# Patient Record
Sex: Female | Born: 2000
Health system: Southern US, Community
[De-identification: ages and names within clinical notes are randomized; demographics above are authoritative.]

## PROBLEM LIST (undated history)

## (undated) DIAGNOSIS — D649 Anemia, unspecified: Secondary | ICD-10-CM

---
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
## 2018-04-17 DIAGNOSIS — Z23 Encounter for immunization: Secondary | ICD-10-CM | POA: Diagnosis not present

## 2018-10-18 DIAGNOSIS — L709 Acne, unspecified: Secondary | ICD-10-CM | POA: Diagnosis not present

## 2018-10-18 DIAGNOSIS — L810 Postinflammatory hyperpigmentation: Secondary | ICD-10-CM | POA: Diagnosis not present

## 2019-02-07 ENCOUNTER — Encounter: Payer: Self-pay | Admitting: Family Medicine

## 2019-02-07 ENCOUNTER — Other Ambulatory Visit: Payer: Self-pay

## 2019-02-07 ENCOUNTER — Ambulatory Visit (INDEPENDENT_AMBULATORY_CARE_PROVIDER_SITE_OTHER): Payer: BLUE CROSS/BLUE SHIELD | Admitting: Family Medicine

## 2019-02-07 VITALS — BP 108/60 | HR 91 | Temp 99.2°F | Resp 12 | Ht 63.0 in | Wt 148.0 lb

## 2019-02-07 DIAGNOSIS — Z789 Other specified health status: Secondary | ICD-10-CM

## 2019-02-07 DIAGNOSIS — E663 Overweight: Secondary | ICD-10-CM | POA: Diagnosis not present

## 2019-02-07 NOTE — Progress Notes (Signed)
Subjective:     Patient ID: Hannah Sampson, female   DOB: 05/25/2001, 18 y.o.   MRN: [id number]  Hannah Sampson presents for New Patient (Initial Visit) (establish care)  Hannah Sampson is a 18 year old female patient who was previously seen by the pediatrician here in town.  Has a history of anemia and heart murmur.  No surgical history noted.  Only history of her family that she knows is her father has hypertension.  Denies use of tobacco, alcohol, drug use.  Sexually active uses pill and condoms.  Lives with her mom dad and brother.  No pets in the home.  Works at [employer] as a Scientist, water quality.  Enjoys reading fiction books.  Enjoys working at this time secondary to the pandemic Boon gets out of the house.  Reports that she eats a wide variety of foods including red meats.  But is not a big sweets person.  But does eat some chips and snacks like that.  Reports she might drink 1 soda a week.  Reports she tries drink 4 bottles of water daily.  Reports that she wear sunscreen and seatbelt.  Reports that she has smoke detectors at home.  Reports she does not use her phone while she is driving.  Health maintenance: Reports she sees the eye and dentist regularly.  Is in need of HIV screening.  In need of tetanus up-to-date.  And flu vaccine coming up.  Otherwise she is been compliant with her healthcare needs.  Is not of age for Pap smear at this time.  Would like to go to GYN for women's health care needs.  Only medication at this time is occasional use of Aleve as needed.  And birth control-Sprintec.  Which she takes on a regular basis.  Without issue.  Overall today she has no issues and does not have anything that she needs to talk about her discussed.  Reports that she just wanted to get into that she woul have somebody come to if she were to get sick.  Today patient denies signs and symptoms of COVID 19 infection including fever, chills, cough, shortness of breath, and headache.  Past Medical,  Surgical, Social History, Allergies, and Medications have been Reviewed.   Past Medical History:  Diagnosis Date  . Anemia    No past surgical history on file. Social History   Socioeconomic History  . Marital status: Single    Spouse name: Not on file  . Number of children: Not on file  . Years of education: Not on file  . Highest education level: Not on file  Occupational History  . Not on file  Social Needs  . Financial resource strain: Not on file  . Food insecurity    Worry: Not on file    Inability: Not on file  . Transportation needs    Medical: Not on file    Non-medical: Not on file  Tobacco Use  . Smoking status: Never Smoker  . Smokeless tobacco: Never Used  Substance and Sexual Activity  . Alcohol use: No  . Drug use: No  . Sexual activity: Not on file  Lifestyle  . Physical activity    Days per week: Not on file    Minutes per session: Not on file  . Stress: Not on file  Relationships  . Social Herbalist on phone: Not on file    Gets together: Not on file    Attends religious service: Not on file  Active member of club or organization: Not on file    Attends meetings of clubs or organizations: Not on file    Relationship status: Not on file  . Intimate partner violence    Fear of current or ex partner: Not on file    Emotionally abused: Not on file    Physically abused: Not on file    Forced sexual activity: Not on file  Other Topics Concern  . Not on file  Social History Narrative  . Not on file    Outpatient Encounter Medications as of 02/07/2019  Medication Sig  . naproxen sodium (ALEVE) 220 MG tablet Take 440 mg by mouth once as needed (for pain).  . norgestimate-ethinyl estradiol (SPRINTEC 28) 0.25-35 MG-MCG tablet Take 1 tablet by mouth daily.   No facility-administered encounter medications on file as of 02/07/2019.    No Known Allergies  Review of Systems  Constitutional: Negative for chills and fever.  HENT:  Negative.   Eyes: Negative for visual disturbance.  Respiratory: Negative for cough and shortness of breath.   Cardiovascular: Negative.   Endocrine: Negative.   Genitourinary: Negative.   Musculoskeletal: Negative.   Skin: Negative.   Allergic/Immunologic: Negative.   Neurological: Negative for dizziness and headaches.  Hematological: Negative.   Psychiatric/Behavioral: Negative.   All other systems reviewed and are negative.      Objective:     BP 108/60   Pulse 91   Temp 99.2 F (37.3 C) (Oral)   Resp 12   Ht 5\' 3"  (1.6 m)   Wt 148 lb 0.6 oz (67.2 kg)   SpO2 98%   BMI 26.22 kg/m   Physical Exam Vitals signs and nursing note reviewed.  Constitutional:      Appearance: Normal appearance.  Neck:     Musculoskeletal: Normal range of motion and neck supple.  Cardiovascular:     Rate and Rhythm: Normal rate and regular rhythm.     Pulses: Normal pulses.     Heart sounds: Normal heart sounds.  Pulmonary:     Effort: Pulmonary effort is normal.     Breath sounds: Normal breath sounds.  Musculoskeletal: Normal range of motion.  Skin:    General: Skin is warm.  Neurological:     Mental Status: She is alert and oriented to person, place, and time.  Psychiatric:        Mood and Affect: Mood normal.        Behavior: Behavior normal.        Thought Content: Thought content normal.        Judgment: Judgment normal.      Depression screen PHQ 2/9 02/07/2019  Decreased Interest 0  Down, Depressed, Hopeless 0  PHQ - 2 Score 0        Assessment and Plan       1. Uses birth control Will need pap in next year. Continue BC at this time. Encouraged her to practice safe sex. [facility] Appreicate collaboration in her care.  - Ambulatory referral to Obstetrics / Gynecology  2. Overweight (BMI 25.0-29.9) Encourage exercise and well balanced diet to help with BMI.  Follow Up: 1 year for annual visit without pap   Hannah FinnerHannah M.  Hannah Ruest, DNP, AGNP-BC [facility] [address], [address] [place], [place] [address] Office Hours: Mon-Thurs 8 am-5 pm; Fri 8 am-12 pm Office Phone:  [phone]  Office Fax: [phone]

## 2019-02-07 NOTE — Patient Instructions (Signed)
    Thank you for coming into the office today. I appreciate the opportunity to provide you with the care for your health and wellness. Today we discussed: overall health  Follow Up: 1 year annual no pap, or as needed  No labs today.  Referral to [facility] for birth control needs and pap once of age.  Continue water intake. Work to walk 30-60 minutes daily 5 days of the week Eat a well balanced diet.  Please continue to practice social distancing to keep you, your family, and our community safe.  If you must go out, please wear a Mask and practice good handwashing.  [place] YOUR HANDS WELL AND FREQUENTLY. AVOID TOUCHING YOUR FACE, UNLESS YOUR HANDS ARE FRESHLY WASHED.  GET FRESH AIR DAILY. STAY HYDRATED WITH WATER.   It was a pleasure to see you and I look forward to continuing to work together on your health and well-being. Please do not hesitate to call the office if you need care or have questions about your care.  Have a wonderful day and week. With Gratitude,  Cherly Beach, DNP, AGNP-BC

## 2019-02-21 ENCOUNTER — Ambulatory Visit (INDEPENDENT_AMBULATORY_CARE_PROVIDER_SITE_OTHER): Payer: BLUE CROSS/BLUE SHIELD | Admitting: Obstetrics and Gynecology

## 2019-02-21 ENCOUNTER — Other Ambulatory Visit: Payer: Self-pay

## 2019-02-21 ENCOUNTER — Encounter: Payer: Self-pay | Admitting: Obstetrics and Gynecology

## 2019-02-21 DIAGNOSIS — Z113 Encounter for screening for infections with a predominantly sexual mode of transmission: Secondary | ICD-10-CM

## 2019-02-21 DIAGNOSIS — Z202 Contact with and (suspected) exposure to infections with a predominantly sexual mode of transmission: Secondary | ICD-10-CM | POA: Insufficient documentation

## 2019-02-21 NOTE — Progress Notes (Signed)
Hannah Sampson presents in referral from her PCP to establish GYN care and STD testing She is currently on OCP's and doing well. Sexual active with same partner x 2 yrs First age intercourse 2 and 5 lifetime partners No H/O STD's No chronic medical problems  Nulligravid No surgery Denies habits  PE AF VSS Lungs clear Heart RRR Abd soft + BS  A/P STD exposure  STD testing ordered. Continue with OCP's. F/U in Feb 2021 for yearly exam

## 2019-02-21 NOTE — Patient Instructions (Signed)
Health Maintenance, Female Adopting a healthy lifestyle and getting preventive care are important in promoting health and wellness. Ask your health care provider about:  The right schedule for you to have regular tests and exams.  Things you can do on your own to prevent diseases and keep yourself healthy. What should I know about diet, weight, and exercise? Eat a healthy diet   Eat a diet that includes plenty of vegetables, fruits, low-fat dairy products, and lean protein.  Do not eat a lot of foods that are high in solid fats, added sugars, or sodium. Maintain a healthy weight Body mass index (BMI) is used to identify weight problems. It estimates body fat based on height and weight. Your health care provider can help determine your BMI and help you achieve or maintain a healthy weight. Get regular exercise Get regular exercise. This is one of the most important things you can do for your health. Most adults should:  Exercise for at least 150 minutes each week. The exercise should increase your heart rate and make you sweat (moderate-intensity exercise).  Do strengthening exercises at least twice a week. This is in addition to the moderate-intensity exercise.  Spend less time sitting. Even light physical activity can be beneficial. Watch cholesterol and blood lipids Have your blood tested for lipids and cholesterol at 20 years of age, then have this test every 5 years. Have your cholesterol levels checked more often if:  Your lipid or cholesterol levels are high.  You are older than 20 years of age.  You are at high risk for heart disease. What should I know about cancer screening? Depending on your health history and family history, you may need to have cancer screening at various ages. This may include screening for:  Breast cancer.  Cervical cancer.  Colorectal cancer.  Skin cancer.  Lung cancer. What should I know about heart disease, diabetes, and high blood  pressure? Blood pressure and heart disease  High blood pressure causes heart disease and increases the risk of stroke. This is more likely to develop in people who have high blood pressure readings, are of African descent, or are overweight.  Have your blood pressure checked: ? Every 3-5 years if you are 18-39 years of age. ? Every year if you are 40 years old or older. Diabetes Have regular diabetes screenings. This checks your fasting blood sugar level. Have the screening done:  Once every three years after age 40 if you are at a normal weight and have a low risk for diabetes.  More often and at a younger age if you are overweight or have a high risk for diabetes. What should I know about preventing infection? Hepatitis B If you have a higher risk for hepatitis B, you should be screened for this virus. Talk with your health care provider to find out if you are at risk for hepatitis B infection. Hepatitis C Testing is recommended for:  Everyone born from 1945 through 1965.  Anyone with known risk factors for hepatitis C. Sexually transmitted infections (STIs)  Get screened for STIs, including gonorrhea and chlamydia, if: ? You are sexually active and are younger than 20 years of age. ? You are older than 20 years of age and your health care provider tells you that you are at risk for this type of infection. ? Your sexual activity has changed since you were last screened, and you are at increased risk for chlamydia or gonorrhea. Ask your health care provider if   you are at risk.  Ask your health care provider about whether you are at high risk for HIV. Your health care provider may recommend a prescription medicine to help prevent HIV infection. If you choose to take medicine to prevent HIV, you should first get tested for HIV. You should then be tested every 3 months for as long as you are taking the medicine. Pregnancy  If you are about to stop having your period (premenopausal) and  you may become pregnant, seek counseling before you get pregnant.  Take 400 to 800 micrograms (mcg) of folic acid every day if you become pregnant.  Ask for birth control (contraception) if you want to prevent pregnancy. Osteoporosis and menopause Osteoporosis is a disease in which the bones lose minerals and strength with aging. This can result in bone fractures. If you are 65 years old or older, or if you are at risk for osteoporosis and fractures, ask your health care provider if you should:  Be screened for bone loss.  Take a calcium or vitamin D supplement to lower your risk of fractures.  Be given hormone replacement therapy (HRT) to treat symptoms of menopause. Follow these instructions at home: Lifestyle  Do not use any products that contain nicotine or tobacco, such as cigarettes, e-cigarettes, and chewing tobacco. If you need help quitting, ask your health care provider.  Do not use street drugs.  Do not share needles.  Ask your health care provider for help if you need support or information about quitting drugs. Alcohol use  Do not drink alcohol if: ? Your health care provider tells you not to drink. ? You are pregnant, may be pregnant, or are planning to become pregnant.  If you drink alcohol: ? Limit how much you use to 0-1 drink a day. ? Limit intake if you are breastfeeding.  Be aware of how much alcohol is in your drink. In the [place] one drink equals one 12 oz bottle of beer (355 mL), one 5 oz glass of wine (148 mL), or one 1 oz glass of hard liquor (44 mL). General instructions  Schedule regular health, dental, and eye exams.  Stay current with your vaccines.  Tell your health care provider if: ? You often feel depressed. ? You have ever been abused or do not feel safe at home. Summary  Adopting a healthy lifestyle and getting preventive care are important in promoting health and wellness.  Follow your health care provider's instructions about healthy  diet, exercising, and getting tested or screened for diseases.  Follow your health care provider's instructions on monitoring your cholesterol and blood pressure. This information is not intended to replace advice given to you by your health care provider. Make sure you discuss any questions you have with your health care provider. Document Released: 01/18/2011 Document Revised: 06/28/2018 Document Reviewed: 06/28/2018 Elsevier Patient Education  2020 [employer]vier Inc[employer]  

## 2019-02-22 LAB — SYPHILIS: RPR W/REFLEX TO RPR TITER AND TREPONEMAL ANTIBODIES, TRADITIONAL SCREENING AND DIAGNOSIS ALGORITHM: RPR Ser Ql: NONREACTIVE

## 2019-02-25 LAB — GC/CHLAMYDIA PROBE AMP
Chlamydia trachomatis, NAA: NEGATIVE
Neisseria Gonorrhoeae by PCR: NEGATIVE

## 2019-02-25 LAB — SPECIMEN STATUS REPORT

## 2019-06-05 ENCOUNTER — Other Ambulatory Visit: Payer: Self-pay | Admitting: *Deleted

## 2019-06-05 DIAGNOSIS — Z20822 Contact with and (suspected) exposure to covid-19: Secondary | ICD-10-CM

## 2019-06-07 LAB — NOVEL CORONAVIRUS, NAA: SARS-CoV-2, NAA: NOT DETECTED

## 2019-06-27 ENCOUNTER — Other Ambulatory Visit: Payer: Self-pay

## 2019-06-27 DIAGNOSIS — Z20822 Contact with and (suspected) exposure to covid-19: Secondary | ICD-10-CM

## 2019-06-28 LAB — NOVEL CORONAVIRUS, NAA: SARS-CoV-2, NAA: NOT DETECTED

## 2019-07-11 ENCOUNTER — Other Ambulatory Visit: Payer: Self-pay

## 2019-07-11 ENCOUNTER — Ambulatory Visit: Payer: BLUE CROSS/BLUE SHIELD | Attending: Internal Medicine

## 2019-07-11 DIAGNOSIS — Z20822 Contact with and (suspected) exposure to covid-19: Secondary | ICD-10-CM

## 2019-07-12 LAB — SPECIMEN STATUS REPORT

## 2019-07-12 LAB — NOVEL CORONAVIRUS, NAA: SARS-CoV-2, NAA: NOT DETECTED

## 2019-09-22 ENCOUNTER — Ambulatory Visit: Payer: BLUE CROSS/BLUE SHIELD | Attending: Internal Medicine

## 2019-10-22 ENCOUNTER — Ambulatory Visit: Payer: BLUE CROSS/BLUE SHIELD | Attending: Internal Medicine

## 2019-10-22 ENCOUNTER — Other Ambulatory Visit: Payer: Self-pay

## 2019-10-22 DIAGNOSIS — Z20822 Contact with and (suspected) exposure to covid-19: Secondary | ICD-10-CM

## 2019-10-23 LAB — SARS-COV-2, NAA 2 DAY TAT

## 2019-10-23 LAB — NOVEL CORONAVIRUS, NAA: SARS-CoV-2, NAA: NOT DETECTED

## 2020-02-12 ENCOUNTER — Ambulatory Visit: Payer: BLUE CROSS/BLUE SHIELD | Admitting: Family Medicine

## 2020-08-17 ENCOUNTER — Other Ambulatory Visit: Payer: Self-pay

## 2020-08-17 ENCOUNTER — Ambulatory Visit
Admission: RE | Admit: 2020-08-17 | Discharge: 2020-08-17 | Disposition: A | Discharge status: Home / Self Care | Payer: 59 | Source: Ambulatory Visit | Attending: Emergency Medicine | Admitting: Emergency Medicine

## 2020-08-17 VITALS — BP 154/89 | HR 94 | Temp 98.0°F | Resp 18 | Ht 63.0 in | Wt 160.0 lb

## 2020-08-17 DIAGNOSIS — J069 Acute upper respiratory infection, unspecified: Secondary | ICD-10-CM

## 2020-08-17 DIAGNOSIS — Z1152 Encounter for screening for COVID-19: Secondary | ICD-10-CM

## 2020-08-17 MED ORDER — PREDNISONE 10 MG PO TABS: 20.0000 mg | ORAL_TABLET | Freq: Every day | ORAL | 0 refills | Status: AC

## 2020-08-17 MED ORDER — CETIRIZINE HCL 10 MG PO TABS: 10.0000 mg | ORAL_TABLET | Freq: Every day | ORAL | 0 refills | Status: DC

## 2020-08-17 MED ORDER — BENZONATATE 100 MG PO CAPS: 100.0000 mg | ORAL_CAPSULE | Freq: Three times a day (TID) | ORAL | 0 refills | Status: DC | PRN

## 2020-08-17 MED ORDER — FLUTICASONE PROPIONATE 50 MCG/ACT NA SUSP: 1.0000 | Freq: Every day | NASAL | 0 refills | Status: DC

## 2020-08-17 NOTE — ED Provider Notes (Signed)
[facility]   [id number] 08/17/20 Arrival Time: 0950   CC: URI  SUBJECTIVE: History from: patient.  Hannah Sampson is a 20 y.o. female who presented to the urgent care for complaint of cough, nasal congestion with yellow nasal discharge for the past 5 days.  Report a negative COVID-19 test 4 days ago via rapid test.  Denies sick exposure to COVID, flu or strep.  Denies recent travel.  Has tried OTC medication without relief.  Denies alleviating or aggravating factors.  Denies  previous symptoms in the past.   Denies fever, chills, fatigue, sinus pain, rhinorrhea, sore throat, SOB, wheezing, chest pain, nausea, changes in bowel or bladder habits.     ROS: As per HPI.  All other pertinent ROS negative.     Past Medical History:  Diagnosis Date  . Anemia    History reviewed. No pertinent surgical history. No Known Allergies No current facility-administered medications on file prior to encounter.   Current Outpatient Medications on File Prior to Encounter  Medication Sig Dispense Refill  . naproxen sodium (ALEVE) 220 MG tablet Take 440 mg by mouth once as needed (for pain).    . norgestimate-ethinyl estradiol (SPRINTEC 28) 0.25-35 MG-MCG tablet Take 1 tablet by mouth daily.     Social History   Socioeconomic History  . Marital status: Single    Spouse name: Not on file  . Number of children: Not on file  . Years of education: Not on file  . Highest education level: Some college, no degree  Occupational History  . Not on file  Tobacco Use  . Smoking status: Never Smoker  . Smokeless tobacco: Never Used  Vaping Use  . Vaping Use: Never used  Substance and Sexual Activity  . Alcohol use: No  . Drug use: No  . Sexual activity: Yes    Birth control/protection: Pill, Condom  Other Topics Concern  . Not on file  Social History Narrative   Lives with mom, dad and brother   No pets       Enjoys reading-fiction favorite Artist, enjoys working to  get outside house      Diet: meat, veggies, fruits (red meats, not big sweets, chips yes)   Caffeine: soda week   Water: 4 bottles daily      Wear seat belt    Wear sunscreen   Smoke detectors at home     Does not use phone while driving    Social Determinants of Corporate investment banker Strain: Not on file  Food Insecurity: Not on file  Transportation Needs: Not on file  Physical Activity: Not on file  Stress: Not on file  Social Connections: Not on file  Intimate Partner Violence: Not on file   Family History  Problem Relation Age of Onset  . Hypertension Father   . Diabetes Paternal Grandfather   . Diabetes Maternal Grandmother     OBJECTIVE:  Vitals:   08/17/20 1029 08/17/20 1033  BP: (!) 154/89   Pulse: 94   Resp: 18   Temp: 98 F (36.7 C)   TempSrc: Oral   SpO2: 97%   Weight:  160 lb (72.6 kg)  Height:  5\' 3"  (1.6 m)     General appearance: alert; appears fatigued, but nontoxic; speaking in full sentences and tolerating own secretions HEENT: NCAT; Ears: EACs clear, TMs pearly gray; Eyes: PERRL.  EOM grossly intact. Sinuses: nontender; Nose: nares patent without rhinorrhea, Throat: oropharynx clear, tonsils non erythematous or  enlarged, uvula midline  Neck: supple without LAD Lungs: unlabored respirations, symmetrical air entry; cough: moderate; no respiratory distress; CTAB Heart: regular rate and rhythm.  Radial pulses 2+ symmetrical bilaterally Skin: warm and dry Psychological: alert and cooperative; normal mood and affect  LABS:  No results found for this or any previous visit (from the past 24 hour(s)).   ASSESSMENT & PLAN:  1. URI with cough and congestion   2. Encounter for screening for COVID-19     Meds ordered this encounter  Medications  . predniSONE (DELTASONE) 10 MG tablet    Sig: Take 2 tablets (20 mg total) by mouth daily for 5 days.    Dispense:  10 tablet    Refill:  0  . fluticasone (FLONASE) 50 MCG/ACT nasal spray    Sig:  Place 1 spray into both nostrils daily for 14 days.    Dispense:  16 g    Refill:  0  . benzonatate (TESSALON) 100 MG capsule    Sig: Take 1 capsule (100 mg total) by mouth 3 (three) times daily as needed for cough.    Dispense:  30 capsule    Refill:  0  . cetirizine (ZYRTEC ALLERGY) 10 MG tablet    Sig: Take 1 tablet (10 mg total) by mouth daily.    Dispense:  30 tablet    Refill:  0    Discharge Instructions    COVID testing ordered.  It will take between 2-7 days for test results.  Someone will contact you regarding abnormal results.    Get plenty of rest and push fluids Tessalon Perles prescribed for cough Zyrtec for nasal congestion, runny nose, and/or sore throat Flonase for nasal congestion and runny nose Prednisone was prescribed Use medications daily for symptom relief Use OTC medications like ibuprofen or tylenol as needed fever or pain Call or go to the ED if you have any new or worsening symptoms such as fever, worsening cough, shortness of breath, chest tightness, chest pain, turning blue, changes in mental status, etc...   Reviewed expectations re: course of current medical issues. Questions answered. Outlined signs and symptoms indicating need for more acute intervention. Patient verbalized understanding. After Visit Summary given.         Durward Parcel, FNP 08/17/20 1053

## 2020-08-17 NOTE — Discharge Instructions (Signed)
COVID testing ordered.  It will take between 2-7 days for test results.  Someone will contact you regarding abnormal results.    Get plenty of rest and push fluids Tessalon Perles prescribed for cough Zyrtec for nasal congestion, runny nose, and/or sore throat Flonase for nasal congestion and runny nose Prednisone was prescribed Use medications daily for symptom relief Use OTC medications like ibuprofen or tylenol as needed fever or pain Call or go to the ED if you have any new or worsening symptoms such as fever, worsening cough, shortness of breath, chest tightness, chest pain, turning blue, changes in mental status, etc... 

## 2020-08-17 NOTE — ED Triage Notes (Signed)
Runny nose, cough, congestion and yellow nasal congestion x 5days. Had neg covid test x 4 days ago

## 2020-08-20 LAB — NOVEL CORONAVIRUS, NAA: SARS-CoV-2, NAA: DETECTED — AB

## 2020-08-21 ENCOUNTER — Telehealth: Payer: Self-pay

## 2020-08-21 NOTE — Telephone Encounter (Signed)
Called to discuss with patient about COVID-19 symptoms and the use of one of the available treatments for those with mild to moderate Covid symptoms and at a high risk of hospitalization.  Pt appears to qualify for outpatient treatment due to co-morbid conditions and/or a member of an at-risk group in accordance with the FDA Emergency Use Authorization.    Symptom onset: 08/12/20 Vaccinated:  X 1 Booster? No Immunocompromised? No Qualifiers: None  Pt. Is out of 7 day window and has no qualifiers.  Hannah Sampson

## 2020-10-22 ENCOUNTER — Other Ambulatory Visit: Payer: Self-pay

## 2020-10-22 ENCOUNTER — Ambulatory Visit (INDEPENDENT_AMBULATORY_CARE_PROVIDER_SITE_OTHER): Payer: 59 | Admitting: Nurse Practitioner

## 2020-10-22 ENCOUNTER — Encounter: Payer: Self-pay | Admitting: Nurse Practitioner

## 2020-10-22 VITALS — BP 156/92 | HR 106 | Temp 97.8°F | Ht 63.0 in | Wt 168.0 lb

## 2020-10-22 DIAGNOSIS — U071 COVID-19: Secondary | ICD-10-CM | POA: Insufficient documentation

## 2020-10-22 DIAGNOSIS — Z139 Encounter for screening, unspecified: Secondary | ICD-10-CM

## 2020-10-22 DIAGNOSIS — Z0000 Encounter for general adult medical examination without abnormal findings: Secondary | ICD-10-CM

## 2020-10-22 DIAGNOSIS — I10 Essential (primary) hypertension: Secondary | ICD-10-CM

## 2020-10-22 DIAGNOSIS — I499 Cardiac arrhythmia, unspecified: Secondary | ICD-10-CM | POA: Insufficient documentation

## 2020-10-22 HISTORY — DX: COVID-19: U07.1

## 2020-10-22 MED ORDER — AMLODIPINE BESYLATE 5 MG PO TABS: 5.0000 mg | ORAL_TABLET | Freq: Every day | ORAL | 3 refills | Status: DC

## 2020-10-22 NOTE — Assessment & Plan Note (Signed)
-  resolved today -had treatment at end of January, and has been asymptomatic for several weeks

## 2020-10-22 NOTE — Assessment & Plan Note (Addendum)
-  HR increases with inspiration; likely sinus arrhythmia -no EKG today as she has no palpitations or SOB

## 2020-10-22 NOTE — Progress Notes (Signed)
Acute Office Visit  Subjective:    Patient ID: Hannah Sampson, female    DOB: [birth year], 20 y.o.   MRN: [id number]  Chief Complaint  Patient presents with  . Follow-up    Has not been seen in awhile.    HPI Patient is in today for COVID follow-up. She was treated for COVID on 08/17/20.  She is asymptomatic today  Past Medical History:  Diagnosis Date  . Anemia     History reviewed. No pertinent surgical history.  Family History  Problem Relation Age of Onset  . Hypertension Father   . Diabetes Paternal Grandfather   . Diabetes Maternal Grandmother     Social History   Socioeconomic History  . Marital status: Single    Spouse name: Not on file  . Number of children: Not on file  . Years of education: Not on file  . Highest education level: Some college, no degree  Occupational History  . Not on file  Tobacco Use  . Smoking status: Never Smoker  . Smokeless tobacco: Never Used  Vaping Use  . Vaping Use: Never used  Substance and Sexual Activity  . Alcohol use: No  . Drug use: No  . Sexual activity: Yes    Birth control/protection: Pill, Condom  Other Topics Concern  . Not on file  Social History Narrative   Lives with mom, dad and brother   No pets       Enjoys reading-fiction favorite Chiropractor, enjoys working to get outside house      Diet: meat, veggies, fruits (red meats, not big sweets, chips yes)   Caffeine: soda week   Water: 4 bottles daily      Wear seat belt    Wear sunscreen   Smoke detectors at home     Does not use phone while driving    Social Determinants of Radio broadcast assistant Strain: Not on file  Food Insecurity: Not on file  Transportation Needs: Not on file  Physical Activity: Not on file  Stress: Not on file  Social Connections: Not on file  Intimate Partner Violence: Not on file    Outpatient Medications Prior to Visit  Medication Sig Dispense Refill  . cetirizine (ZYRTEC ALLERGY) 10 MG tablet  Take 1 tablet (10 mg total) by mouth daily. 30 tablet 0  . naproxen sodium (ALEVE) 220 MG tablet Take 440 mg by mouth once as needed (for pain).    . norgestimate-ethinyl estradiol (ORTHO-CYCLEN) 0.25-35 MG-MCG tablet Take 1 tablet by mouth daily.    . fluticasone (FLONASE) 50 MCG/ACT nasal spray Place 1 spray into both nostrils daily for 14 days. 16 g 0  . benzonatate (TESSALON) 100 MG capsule Take 1 capsule (100 mg total) by mouth 3 (three) times daily as needed for cough. (Patient not taking: Reported on 10/22/2020) 30 capsule 0   No facility-administered medications prior to visit.    No Known Allergies  Review of Systems  Constitutional: Negative.   Respiratory: Negative.   Cardiovascular: Negative.   Psychiatric/Behavioral: Negative.        Objective:    Physical Exam Constitutional:      Appearance: Normal appearance.  Cardiovascular:     Rate and Rhythm: Normal rate. Rhythm irregular.     Pulses: Normal pulses.     Heart sounds: Normal heart sounds.     Comments: Has underlying regular rhythm, but heart rate increases when she takes a deep breath Pulmonary:  Effort: Pulmonary effort is normal.     Breath sounds: Normal breath sounds.  Neurological:     Mental Status: She is alert.  Psychiatric:        Mood and Affect: Mood normal.        Behavior: Behavior normal.        Thought Content: Thought content normal.        Judgment: Judgment normal.     BP (!) 156/92 (BP Location: Right Arm, Patient Position: Sitting, Cuff Size: Normal)   Pulse (!) 106   Temp 97.8 F (36.6 C) (Temporal)   Ht '5\' 3"'  (1.6 m)   Wt 168 lb (76.2 kg)   LMP 10/20/2020   SpO2 98%   BMI 29.76 kg/m  Wt Readings from Last 3 Encounters:  10/22/20 168 lb (76.2 kg)  08/17/20 160 lb (72.6 kg)  02/21/19 145 lb 3.2 oz (65.9 kg)    Health Maintenance Due  Topic Date Due  . Hepatitis C Screening  Never done  . HIV Screening  Never done  . CHLAMYDIA SCREENING  02/21/2020    There are  no preventive care reminders to display for this patient.   No results found for: TSH No results found for: WBC, HGB, HCT, MCV, PLT No results found for: NA, K, CHLORIDE, CO2, GLUCOSE, BUN, CREATININE, BILITOT, ALKPHOS, AST, ALT, PROT, ALBUMIN, CALCIUM, ANIONGAP, EGFR, GFR No results found for: CHOL No results found for: HDL No results found for: LDLCALC No results found for: TRIG No results found for: CHOLHDL No results found for: HGBA1C     Assessment & Plan:   Problem List Items Addressed This Visit      Cardiovascular and Mediastinum   Arrhythmia    -HR increases with inspiration; likely sinus arrhythmia -no EKG today as she has no palpitations or SOB       Relevant Medications   amLODipine (NORVASC) 5 MG tablet   Hypertension, essential    BP Readings from Last 3 Encounters:  10/22/20 (!) 156/92  08/17/20 (!) 154/89  02/21/19 (!) 143/88  -manual BP today 146/88 -rx. amlodipine       Relevant Medications   amLODipine (NORVASC) 5 MG tablet   Other Relevant Orders   CMP14+EGFR   CBC with Differential/Platelet   Lipid Panel With LDL/HDL Ratio     Other   COVID    -resolved today -had treatment at end of January, and has been asymptomatic for several weeks       Other Visit Diagnoses    Annual physical exam    -  Primary   Relevant Orders   HIV antibody (with reflex)   Hepatitis C Antibody   Chlamydia/GC NAA, Confirmation   CMP14+EGFR   CBC with Differential/Platelet   Lipid Panel With LDL/HDL Ratio   Screening due       Relevant Orders   HIV antibody (with reflex)   Hepatitis C Antibody   Chlamydia/GC NAA, Confirmation       Meds ordered this encounter  Medications  . amLODipine (NORVASC) 5 MG tablet    Sig: Take 1 tablet (5 mg total) by mouth daily.    Dispense:  90 tablet    Refill:  [place], NP

## 2020-10-22 NOTE — Patient Instructions (Signed)
Please have fasting labs drawn 2-3 days prior to your appointment so we can discuss the results during your office visit.  

## 2020-10-22 NOTE — Assessment & Plan Note (Addendum)
BP Readings from Last 3 Encounters:  10/22/20 (!) 156/92  08/17/20 (!) 154/89  02/21/19 (!) 143/88  -manual BP today 146/88 -rx. amlodipine

## 2020-10-25 LAB — CHLAMYDIA/GC NAA, CONFIRMATION
Chlamydia trachomatis, NAA: NEGATIVE
Neisseria gonorrhoeae, NAA: NEGATIVE

## 2020-10-27 NOTE — Progress Notes (Signed)
Negative gc/cl

## 2020-11-19 ENCOUNTER — Encounter: Payer: 59 | Admitting: Nurse Practitioner

## 2020-12-16 ENCOUNTER — Other Ambulatory Visit: Payer: Self-pay

## 2020-12-16 ENCOUNTER — Ambulatory Visit (INDEPENDENT_AMBULATORY_CARE_PROVIDER_SITE_OTHER): Payer: 59 | Admitting: Nurse Practitioner

## 2020-12-16 ENCOUNTER — Encounter: Payer: Self-pay | Admitting: Nurse Practitioner

## 2020-12-16 VITALS — BP 151/91 | HR 88 | Temp 98.1°F | Resp 20 | Ht 63.0 in | Wt 170.0 lb

## 2020-12-16 DIAGNOSIS — I10 Essential (primary) hypertension: Secondary | ICD-10-CM

## 2020-12-16 DIAGNOSIS — E049 Nontoxic goiter, unspecified: Secondary | ICD-10-CM | POA: Diagnosis not present

## 2020-12-16 DIAGNOSIS — Z0001 Encounter for general adult medical examination with abnormal findings: Secondary | ICD-10-CM | POA: Diagnosis not present

## 2020-12-16 DIAGNOSIS — R011 Cardiac murmur, unspecified: Secondary | ICD-10-CM | POA: Diagnosis not present

## 2020-12-16 MED ORDER — AMLODIPINE BESYLATE 10 MG PO TABS: 10.0000 mg | ORAL_TABLET | Freq: Every day | ORAL | 1 refills | Status: DC

## 2020-12-16 NOTE — Assessment & Plan Note (Signed)
-  no murmur auscultated on PE today

## 2020-12-16 NOTE — Assessment & Plan Note (Signed)
-  enlarged thyroid on physical exam today 

## 2020-12-16 NOTE — Assessment & Plan Note (Signed)
-  will check thyroid labs -U/S ordered -she states her mom has enlarged thyroid

## 2020-12-16 NOTE — Progress Notes (Signed)
Established Patient Office Visit  Subjective:  Patient ID: Hannah Sampson, female    DOB: 11-Apr-2001  Age: 20 y.o. MRN: [id number]  CC:  Chief Complaint  Patient presents with  . Annual Exam    HPI Hannah Sampson presents for physical exam. No acute concerns.   Past Medical History:  Diagnosis Date  . Anemia   . COVID 10/22/2020    History reviewed. No pertinent surgical history.  Family History  Problem Relation Age of Onset  . Hypertension Father   . Diabetes Paternal Grandfather   . Diabetes Maternal Grandmother     Social History   Socioeconomic History  . Marital status: Single    Spouse name: Not on file  . Number of children: Not on file  . Years of education: Not on file  . Highest education level: Some college, no degree  Occupational History  . Not on file  Tobacco Use  . Smoking status: Never Smoker  . Smokeless tobacco: Never Used  Vaping Use  . Vaping Use: Never used  Substance and Sexual Activity  . Alcohol use: No  . Drug use: No  . Sexual activity: Yes    Birth control/protection: Pill, Condom  Other Topics Concern  . Not on file  Social History Narrative   Lives with mom, dad and brother   No pets       Enjoys reading-fiction favorite Chiropractor, enjoys working to get outside house      Diet: meat, veggies, fruits (red meats, not big sweets, chips yes)   Caffeine: soda week   Water: 4 bottles daily      Wear seat belt    Wear sunscreen   Smoke detectors at home     Does not use phone while driving    Social Determinants of Radio broadcast assistant Strain: Not on file  Food Insecurity: Not on file  Transportation Needs: Not on file  Physical Activity: Not on file  Stress: Not on file  Social Connections: Not on file  Intimate Partner Violence: Not on file    Outpatient Medications Prior to Visit  Medication Sig Dispense Refill  . cetirizine (ZYRTEC ALLERGY) 10 MG tablet Take 1 tablet (10 mg total) by  mouth daily. 30 tablet 0  . naproxen sodium (ALEVE) 220 MG tablet Take 440 mg by mouth once as needed (for pain).    . norgestimate-ethinyl estradiol (ORTHO-CYCLEN) 0.25-35 MG-MCG tablet Take 1 tablet by mouth daily.    Marland Kitchen amLODipine (NORVASC) 5 MG tablet Take 1 tablet (5 mg total) by mouth daily. 90 tablet 3  . fluticasone (FLONASE) 50 MCG/ACT nasal spray Place 1 spray into both nostrils daily for 14 days. 16 g 0   No facility-administered medications prior to visit.    No Known Allergies  ROS Review of Systems  Constitutional: Negative.   HENT: Negative.   Eyes: Negative.   Respiratory: Negative.   Cardiovascular: Negative.   Gastrointestinal: Negative.   Endocrine: Negative.   Genitourinary: Negative.   Musculoskeletal: Negative.   Skin: Negative.   Allergic/Immunologic: Negative.   Neurological: Negative.   Hematological: Negative.   Psychiatric/Behavioral: Negative.       Objective:    Physical Exam Constitutional:      Appearance: Normal appearance.  HENT:     Head: Normocephalic and atraumatic.     Right Ear: Tympanic membrane, ear canal and external ear normal.     Left Ear: Tympanic membrane, ear canal and external ear normal.  Nose: Nose normal.     Mouth/Throat:     Mouth: Mucous membranes are moist.     Pharynx: Oropharynx is clear.  Eyes:     Extraocular Movements: Extraocular movements intact.     Conjunctiva/sclera: Conjunctivae normal.     Pupils: Pupils are equal, round, and reactive to light.  Neck:     Comments: Enlarged thyroid Cardiovascular:     Rate and Rhythm: Normal rate and regular rhythm.     Pulses: Normal pulses.     Heart sounds: Normal heart sounds.  Pulmonary:     Effort: Pulmonary effort is normal.     Breath sounds: Normal breath sounds.  Abdominal:     General: Abdomen is flat. Bowel sounds are normal.  Musculoskeletal:        General: Normal range of motion.  Skin:    General: Skin is warm.     Capillary Refill:  Capillary refill takes less than 2 seconds.  Neurological:     General: No focal deficit present.     Mental Status: She is alert and oriented to person, place, and time.  Psychiatric:        Mood and Affect: Mood normal.        Behavior: Behavior normal.        Thought Content: Thought content normal.        Judgment: Judgment normal.     BP (!) 151/91   Pulse 88   Temp 98.1 F (36.7 C)   Resp 20   Ht $R'5\' 3"'dr$  (1.6 m)   Wt 170 lb (77.1 kg)   SpO2 99%   BMI 30.11 kg/m  Wt Readings from Last 3 Encounters:  12/16/20 170 lb (77.1 kg)  10/22/20 168 lb (76.2 kg)  08/17/20 160 lb (72.6 kg)     Health Maintenance Due  Topic Date Due  . HIV Screening  Never done  . Hepatitis C Screening  Never done    There are no preventive care reminders to display for this patient.  No results found for: TSH No results found for: WBC, HGB, HCT, MCV, PLT No results found for: NA, K, CHLORIDE, CO2, GLUCOSE, BUN, CREATININE, BILITOT, ALKPHOS, AST, ALT, PROT, ALBUMIN, CALCIUM, ANIONGAP, EGFR, GFR No results found for: CHOL No results found for: HDL No results found for: LDLCALC No results found for: TRIG No results found for: CHOLHDL No results found for: HGBA1C    Assessment & Plan:   Problem List Items Addressed This Visit      Cardiovascular and Mediastinum   Hypertension, essential    BP Readings from Last 3 Encounters:  12/16/20 (!) 151/91  10/22/20 (!) 156/92  08/17/20 (!) 154/89   -INCREASE amlodipine to 10 mg      Relevant Medications   amLODipine (NORVASC) 10 MG tablet     Endocrine   Enlarged thyroid gland    -will check thyroid labs -U/S ordered -she states her mom has enlarged thyroid      Relevant Orders   US THYROID   TSH + free T4   T3, free     Other   Heart murmur    -no murmur auscultated on PE today      Encounter for general adult medical examination with abnormal findings - Primary    -enlarged thyroid on physical exam today          Meds ordered this encounter  Medications  . amLODipine (NORVASC) 10 MG tablet    Sig: Take 1  tablet (10 mg total) by mouth daily.    Dispense:  90 tablet    Refill:  1    Follow-up: Return in about 1 month (around 01/15/2021) for BP check (Amlodipine).    Noreene Larsson, NP

## 2020-12-16 NOTE — Assessment & Plan Note (Signed)
BP Readings from Last 3 Encounters:  12/16/20 (!) 151/91  10/22/20 (!) 156/92  08/17/20 (!) 154/89   -INCREASE amlodipine to 10 mg

## 2020-12-17 LAB — CBC WITH DIFFERENTIAL/PLATELET
Basophils Absolute: 0 10*3/uL (ref 0.0–0.2)
Basos: 1 %
EOS (ABSOLUTE): 0.1 10*3/uL (ref 0.0–0.4)
Eos: 3 %
Hematocrit: 38.5 % (ref 34.0–46.6)
Hemoglobin: 12.2 g/dL (ref 11.1–15.9)
Immature Grans (Abs): 0 10*3/uL (ref 0.0–0.1)
Immature Granulocytes: 0 %
Lymphocytes Absolute: 1.6 10*3/uL (ref 0.7–3.1)
Lymphs: 42 %
MCH: 27.5 pg (ref 26.6–33.0)
MCHC: 31.7 g/dL (ref 31.5–35.7)
MCV: 87 fL (ref 79–97)
Monocytes Absolute: 0.3 10*3/uL (ref 0.1–0.9)
Monocytes: 7 %
Neutrophils Absolute: 1.9 10*3/uL (ref 1.4–7.0)
Neutrophils: 47 %
Platelets: 259 10*3/uL (ref 150–450)
RBC: 4.44 x10E6/uL (ref 3.77–5.28)
RDW: 14.4 % (ref 11.7–15.4)
WBC: 3.9 10*3/uL (ref 3.4–10.8)

## 2020-12-17 LAB — CMP14+EGFR
ALT: 12 IU/L (ref 0–32)
AST: 9 IU/L (ref 0–40)
Albumin/Globulin Ratio: 1.5 (ref 1.2–2.2)
Albumin: 4.2 g/dL (ref 3.9–5.0)
Alkaline Phosphatase: 74 IU/L (ref 44–121)
BUN/Creatinine Ratio: 16 (ref 9–23)
BUN: 11 mg/dL (ref 6–20)
Bilirubin Total: <0.2 mg/dL (ref 0.0–1.2)
CO2: 20 mmol/L (ref 20–29)
Calcium: 9 mg/dL (ref 8.7–10.2)
Chloride: 103 mmol/L (ref 96–106)
Creatinine, Ser: 0.7 mg/dL (ref 0.57–1.00)
Globulin, Total: 2.8 g/dL (ref 1.5–4.5)
Glucose: 85 mg/dL (ref 65–99)
Potassium: 4.6 mmol/L (ref 3.5–5.2)
Sodium: 138 mmol/L (ref 134–144)
Total Protein: 7 g/dL (ref 6.0–8.5)
eGFR: 125 mL/min/{1.73_m2} (ref >59)

## 2020-12-17 LAB — T3, FREE: T3, Free: 3 pg/mL (ref 2.0–4.4)

## 2020-12-17 LAB — LIPID PANEL WITH LDL/HDL RATIO
Cholesterol, Total: 189 mg/dL (ref 100–199)
HDL: 70 mg/dL (ref >39)
LDL Chol Calc (NIH): 107 mg/dL — ABNORMAL HIGH (ref 0–99)
LDL/HDL Ratio: 1.5 ratio (ref 0.0–3.2)
Triglycerides: 66 mg/dL (ref 0–149)
VLDL Cholesterol Cal: 12 mg/dL (ref 5–40)

## 2020-12-17 LAB — TSH+FREE T4
Free T4: 1.18 ng/dL (ref 0.82–1.77)
TSH: 0.669 u[IU]/mL (ref 0.450–4.500)

## 2020-12-17 LAB — HEPATITIS C ANTIBODY: Hep C Virus Ab: <0.1 s/co ratio (ref 0.0–0.9)

## 2020-12-17 LAB — HIV ANTIBODY (ROUTINE TESTING W REFLEX): HIV Screen 4th Generation wRfx: NONREACTIVE

## 2020-12-17 NOTE — Progress Notes (Signed)
Her cholesterol is a little high, but it is low enough that cutting out fried/fatty foods should help. No need to add medicine.

## 2020-12-17 NOTE — Progress Notes (Signed)
Thyroid labs look great!

## 2020-12-25 ENCOUNTER — Telehealth: Payer: Self-pay

## 2020-12-25 NOTE — Telephone Encounter (Signed)
Patient mother [name]) called asking for letter for airline, leaving out of the country Monday. Said she had blood work done here in our office and came back she had anitbodies and showed she had covid.  But no covid swab test was done. Contact patient # [phone].

## 2020-12-25 NOTE — Telephone Encounter (Signed)
Left message to get more clarification on this?

## 2020-12-26 ENCOUNTER — Ambulatory Visit (HOSPITAL_COMMUNITY)
Admission: RE | Admit: 2020-12-26 | Discharge: 2020-12-26 | Disposition: A | Discharge status: Home / Self Care | Payer: 59 | Source: Ambulatory Visit | Attending: Nurse Practitioner | Admitting: Nurse Practitioner

## 2020-12-26 ENCOUNTER — Other Ambulatory Visit: Payer: Self-pay

## 2020-12-26 DIAGNOSIS — E049 Nontoxic goiter, unspecified: Secondary | ICD-10-CM | POA: Insufficient documentation

## 2020-12-29 NOTE — Telephone Encounter (Signed)
The last covid test was done by the hosp on 08/17/20 when she was at [facility] ER for illness.

## 2020-12-29 NOTE — Telephone Encounter (Signed)
Left message

## 2020-12-29 NOTE — Progress Notes (Signed)
Radiology said that they found 1 thyroid nodule, but it was benign and mostly a cyst (fluid filled) and no biopsy is needed and no further imaging is required.

## 2020-12-30 NOTE — Telephone Encounter (Signed)
Left message, unable to contact pt by phone? Left message to call our office.

## 2021-01-05 ENCOUNTER — Telehealth: Payer: Self-pay

## 2021-01-05 NOTE — Telephone Encounter (Signed)
Patient returning call about lab results, she has been out of town on vacation. Call back # [phone]

## 2021-01-06 NOTE — Telephone Encounter (Signed)
PT INFORMED OF RESULTS.

## 2021-01-16 ENCOUNTER — Ambulatory Visit: Payer: 59 | Admitting: Family Medicine

## 2021-01-20 ENCOUNTER — Ambulatory Visit: Payer: 59 | Admitting: Nurse Practitioner

## 2021-10-01 ENCOUNTER — Encounter: Payer: 59 | Admitting: Obstetrics & Gynecology

## 2021-12-17 ENCOUNTER — Encounter: Payer: Self-pay | Admitting: Family Medicine

## 2021-12-17 ENCOUNTER — Ambulatory Visit (INDEPENDENT_AMBULATORY_CARE_PROVIDER_SITE_OTHER): Payer: BC Managed Care – PPO | Admitting: Family Medicine

## 2021-12-17 VITALS — BP 118/72 | HR 82 | Ht 63.0 in | Wt 173.4 lb

## 2021-12-17 DIAGNOSIS — R7301 Impaired fasting glucose: Secondary | ICD-10-CM | POA: Diagnosis not present

## 2021-12-17 DIAGNOSIS — Z0001 Encounter for general adult medical examination with abnormal findings: Secondary | ICD-10-CM | POA: Diagnosis not present

## 2021-12-17 DIAGNOSIS — I10 Essential (primary) hypertension: Secondary | ICD-10-CM | POA: Diagnosis not present

## 2021-12-17 DIAGNOSIS — Z23 Encounter for immunization: Secondary | ICD-10-CM | POA: Diagnosis not present

## 2021-12-17 DIAGNOSIS — E559 Vitamin D deficiency, unspecified: Secondary | ICD-10-CM | POA: Diagnosis not present

## 2021-12-17 NOTE — Patient Instructions (Addendum)
I appreciate the opportunity to provide care to you today!    Follow up:  3 months  Labs: please stop by the lab during the week to get your blood drawn (CBC, CMP, TSH, Lipid profile, HgA1c, Vit D)    Thank you for getting you HPV and Tdap vaccine today     Please continue to a heart-healthy diet and increase your physical activities. Try to exercise for 22mins at least three times a week.      It was a pleasure to see you and I look forward to continuing to work together on your health and well-being. Please do not hesitate to call the office if you need care or have questions about your care.   Have a wonderful day and week. With Gratitude, Alvira Monday MSN, FNP-BC

## 2021-12-17 NOTE — Assessment & Plan Note (Signed)
-  Controlled  -BP today is 118/72 -Encouraged to continue Norvasc

## 2021-12-17 NOTE — Progress Notes (Signed)
New Patient Office Visit  Subjective:  Patient ID: Hannah Sampson, female    DOB: 2001-01-02  Age: 21 y.o. MRN: [id number]  CC:  Chief Complaint  Patient presents with   New Patient (Initial Visit)    Pt would like a general checkup no health concerns. Due for pap smear will come on another visit.     HPI Hannah Sampson is a 21 y.o. female with past medical history of essential hypertension for establishing care.  She received her Tdap and HPV vaccine today.  Past Medical History:  Diagnosis Date   Anemia    COVID 10/22/2020    History reviewed. No pertinent surgical history.  Family History  Problem Relation Age of Onset   Hypertension Father    Diabetes Paternal Grandfather    Diabetes Maternal Grandmother     Social History   Socioeconomic History   Marital status: Single    Spouse name: Not on file   Number of children: Not on file   Years of education: Not on file   Highest education level: Some college, no degree  Occupational History   Not on file  Tobacco Use   Smoking status: Never   Smokeless tobacco: Never  Vaping Use   Vaping Use: Never used  Substance and Sexual Activity   Alcohol use: No   Drug use: No   Sexual activity: Yes    Birth control/protection: Pill, Condom  Other Topics Concern   Not on file  Social History Narrative   Lives with mom, dad and brother   No pets       Enjoys reading-fiction favorite Chiropractor, enjoys working to get outside house      Diet: meat, veggies, fruits (red meats, not big sweets, chips yes)   Caffeine: soda week   Water: 4 bottles daily      Wear seat belt    Wear sunscreen   Smoke detectors at home     Does not use phone while driving    Social Determinants of Radio broadcast assistant Strain: Not on file  Food Insecurity: Not on file  Transportation Needs: Not on file  Physical Activity: Not on file  Stress: Not on file  Social Connections: Not on file  Intimate Partner  Violence: Not on file    ROS Review of Systems  Constitutional:  Negative for chills, fatigue and fever.  HENT:  Negative for sinus pressure, sinus pain and sore throat.   Eyes:  Negative for pain, redness and itching.  Respiratory:  Negative for cough, chest tightness and shortness of breath.   Cardiovascular:  Negative for chest pain and palpitations.  Gastrointestinal:  Negative for constipation, diarrhea and nausea.  Endocrine: Negative for polydipsia, polyphagia and polyuria.  Genitourinary:  Negative for difficulty urinating, frequency and urgency.  Musculoskeletal:  Negative for back pain and neck pain.  Skin:  Negative for rash and wound.  Neurological:  Negative for dizziness, tremors and facial asymmetry.  Hematological:  Does not bruise/bleed easily.  Psychiatric/Behavioral:  Negative for confusion, self-injury, sleep disturbance and suicidal ideas.    Objective:   Today's Vitals: BP 118/72   Pulse 82   Ht '5\' 3"'  (1.6 m)   Wt 173 lb 6.4 oz (78.7 kg)   LMP 12/13/2021   SpO2 100%   BMI 30.72 kg/m   Physical Exam HENT:     Head: Normocephalic.     Right Ear: External ear normal.     Left Ear: External  ear normal.     Nose: No congestion or rhinorrhea.     Mouth/Throat:     Mouth: Mucous membranes are moist.  Eyes:     Extraocular Movements: Extraocular movements intact.     Pupils: Pupils are equal, round, and reactive to light.  Cardiovascular:     Rate and Rhythm: Normal rate and regular rhythm.  Pulmonary:     Effort: Pulmonary effort is normal.     Breath sounds: Normal breath sounds.  Abdominal:     Palpations: Abdomen is soft.  Musculoskeletal:        General: No deformity or signs of injury.     Cervical back: No rigidity or tenderness.  Skin:    Capillary Refill: Capillary refill takes less than 2 seconds.     Findings: No bruising or erythema.  Neurological:     Mental Status: She is alert and oriented to person, place, and time.  Psychiatric:      Comments: Normal affect    Assessment & Plan:   Problem List Items Addressed This Visit       Cardiovascular and Mediastinum   Hypertension, essential    -Controlled  -BP today is 118/72 -Encouraged to continue Norvasc         Other   Encounter for general adult medical examination with abnormal findings   Relevant Orders   CBC with Differential/Platelet   CMP14+EGFR   TSH + free T4   Lipid panel   Other Visit Diagnoses     Need for diphtheria-tetanus-pertussis (Tdap) vaccine    -  Primary   Relevant Orders   Tdap vaccine greater than or equal to 7yo IM   Need for HPV vaccine       Relevant Orders   HPV 9-valent vaccine,Recombinat   Vitamin D deficiency       Relevant Orders   Vitamin D (25 hydroxy)   IFG (impaired fasting glucose)       Relevant Orders   Hemoglobin A1C       Outpatient Encounter Medications as of 12/17/2021  Medication Sig   amLODipine (NORVASC) 10 MG tablet Take 1 tablet (10 mg total) by mouth daily.   cetirizine (ZYRTEC ALLERGY) 10 MG tablet Take 1 tablet (10 mg total) by mouth daily.   naproxen sodium (ALEVE) 220 MG tablet Take 440 mg by mouth once as needed (for pain).   norgestimate-ethinyl estradiol (ORTHO-CYCLEN) 0.25-35 MG-MCG tablet Take 1 tablet by mouth daily.   No facility-administered encounter medications on file as of 12/17/2021.    Follow-up: Return in about 3 months (around 03/19/2022) for pap.   Alvira Monday, FNP

## 2022-01-28 ENCOUNTER — Telehealth: Payer: Self-pay | Admitting: Family Medicine

## 2022-01-28 NOTE — Telephone Encounter (Signed)
Patient needs refill on   amLODipine (NORVASC) 10 MG tablet   [facility] on [address]

## 2022-01-29 ENCOUNTER — Other Ambulatory Visit: Payer: Self-pay

## 2022-01-29 DIAGNOSIS — I10 Essential (primary) hypertension: Secondary | ICD-10-CM

## 2022-01-29 MED ORDER — AMLODIPINE BESYLATE 10 MG PO TABS: 10.0000 mg | ORAL_TABLET | Freq: Every day | ORAL | 1 refills | Status: DC

## 2022-01-29 NOTE — Telephone Encounter (Signed)
Refill sent.

## 2022-03-18 ENCOUNTER — Encounter: Payer: Self-pay | Admitting: Family Medicine

## 2022-03-19 ENCOUNTER — Ambulatory Visit: Payer: BC Managed Care – PPO | Admitting: Family Medicine

## 2022-03-25 ENCOUNTER — Other Ambulatory Visit (HOSPITAL_COMMUNITY)
Admission: RE | Admit: 2022-03-25 | Discharge: 2022-03-25 | Disposition: A | Discharge status: Home / Self Care | Payer: BC Managed Care – PPO | Source: Ambulatory Visit | Attending: Family Medicine | Admitting: Family Medicine

## 2022-03-25 ENCOUNTER — Ambulatory Visit (INDEPENDENT_AMBULATORY_CARE_PROVIDER_SITE_OTHER): Payer: BC Managed Care – PPO | Admitting: Family Medicine

## 2022-03-25 ENCOUNTER — Encounter: Payer: Self-pay | Admitting: Family Medicine

## 2022-03-25 VITALS — BP 126/78 | HR 87 | Ht 63.0 in | Wt 171.0 lb

## 2022-03-25 DIAGNOSIS — E559 Vitamin D deficiency, unspecified: Secondary | ICD-10-CM

## 2022-03-25 DIAGNOSIS — Z23 Encounter for immunization: Secondary | ICD-10-CM

## 2022-03-25 DIAGNOSIS — Z124 Encounter for screening for malignant neoplasm of cervix: Secondary | ICD-10-CM | POA: Insufficient documentation

## 2022-03-25 DIAGNOSIS — R7301 Impaired fasting glucose: Secondary | ICD-10-CM | POA: Diagnosis not present

## 2022-03-25 NOTE — Assessment & Plan Note (Signed)
-   According to the [employer], cytology and HPV co-testing (preferred) every 5 years or cytology alone (acceptable) every 3 years. - Pap due 2026/2028 

## 2022-03-25 NOTE — Progress Notes (Signed)
Established Patient Office Visit  Subjective:  Patient ID: Hannah Sampson, female    DOB: 2001/01/20  Age: 21 y.o. MRN: [id number]  CC:  Chief Complaint  Patient presents with   Follow-up    Follow up, will be getting pap.     HPI Hannah Sampson is a 21 y.o. female with past medical history of essential hypertension presents for a pap smear with no complaints voiced   Past Medical History:  Diagnosis Date   Anemia    COVID 10/22/2020    History reviewed. No pertinent surgical history.  Family History  Problem Relation Age of Onset   Hypertension Father    Diabetes Paternal Grandfather    Diabetes Maternal Grandmother     Social History   Socioeconomic History   Marital status: Single    Spouse name: Not on file   Number of children: Not on file   Years of education: Not on file   Highest education level: Some college, no degree  Occupational History   Not on file  Tobacco Use   Smoking status: Never   Smokeless tobacco: Never  Vaping Use   Vaping Use: Never used  Substance and Sexual Activity   Alcohol use: No   Drug use: No   Sexual activity: Yes    Birth control/protection: Pill, Condom  Other Topics Concern   Not on file  Social History Narrative   Lives with mom, dad and brother   No pets       Enjoys reading-fiction favorite Chiropractor, enjoys working to get outside house      Diet: meat, veggies, fruits (red meats, not big sweets, chips yes)   Caffeine: soda week   Water: 4 bottles daily      Wear seat belt    Wear sunscreen   Smoke detectors at home     Does not use phone while driving    Social Determinants of Health   Financial Resource Strain: Low Risk  (02/07/2019)   Overall Financial Resource Strain (CARDIA)    Difficulty of Paying Living Expenses: Not hard at all  Food Insecurity: No Food Insecurity (02/07/2019)   Hunger Vital Sign    Worried About Running Out of Food in the Last Year: Never true    Mack in  the Last Year: Never true  Transportation Needs: No Transportation Needs (02/07/2019)   PRAPARE - Hydrologist (Medical): No    Lack of Transportation (Non-Medical): No  Physical Activity: Inactive (02/07/2019)   Exercise Vital Sign    Days of Exercise per Week: 0 days    Minutes of Exercise per Session: 0 min  Stress: No Stress Concern Present (02/07/2019)   [place]    Feeling of Stress : Not at all  Social Connections: Moderately Integrated (02/07/2019)   Social Connection and Isolation Panel [NHANES]    Frequency of Communication with Friends and Family: More than three times a week    Frequency of Social Gatherings with Friends and Family: Three times a week    Attends Religious Services: More than 4 times per year    Active Member of Clubs or Organizations: Yes    Attends Archivist Meetings: Never    Marital Status: Never married  Intimate Partner Violence: Not At Risk (02/07/2019)   Humiliation, Afraid, Rape, and Kick questionnaire    Fear of Current or Ex-Partner: No  Emotionally Abused: No    Physically Abused: No    Sexually Abused: No    Outpatient Medications Prior to Visit  Medication Sig Dispense Refill   amLODipine (NORVASC) 10 MG tablet Take 1 tablet (10 mg total) by mouth daily. 90 tablet 1   naproxen sodium (ALEVE) 220 MG tablet Take 440 mg by mouth once as needed (for pain).     norgestimate-ethinyl estradiol (ORTHO-CYCLEN) 0.25-35 MG-MCG tablet Take 1 tablet by mouth daily.     cetirizine (ZYRTEC ALLERGY) 10 MG tablet Take 1 tablet (10 mg total) by mouth daily. 30 tablet 0   No facility-administered medications prior to visit.    No Known Allergies  ROS Review of Systems  Constitutional:  Negative for fatigue and fever.  Respiratory:  Negative for chest tightness and shortness of breath.   Cardiovascular:  Negative for chest pain and palpitations.   Genitourinary:  Negative for decreased urine volume, pelvic pain, urgency, vaginal bleeding, vaginal discharge and vaginal pain.  Psychiatric/Behavioral:  Negative for self-injury and suicidal ideas.       Objective:    Physical Exam Cardiovascular:     Rate and Rhythm: Normal rate and regular rhythm.     Pulses: Normal pulses.     Heart sounds: Normal heart sounds.  Pulmonary:     Effort: Pulmonary effort is normal.     Breath sounds: Normal breath sounds.  Abdominal:     Palpations: Abdomen is soft.  Genitourinary:    General: Normal vulva.     Exam position: Lithotomy position.     Pubic Area: No rash or pubic lice.      Tanner stage (genital): 5.     Labia:        Right: No rash, tenderness, lesion or injury.        Left: No rash, tenderness, lesion or injury.      Comments: Vaginal wall: pink and rugated, smooth and non-tender; absence of lesions, edema, and erythema. Labia Majora and Minora: present bilaterally, moist, soft tissue, and homogeneous; free of edema and ulcerations. Clitoris is anatomically present, above the urethral, and free of lesions, masses, and ulceration.   Neurological:     Mental Status: She is alert.     BP 126/78   Pulse 87   Ht '5\' 3"'  (1.6 m)   Wt 171 lb 0.6 oz (77.6 kg)   LMP 03/08/2022   SpO2 98%   BMI 30.30 kg/m  Wt Readings from Last 3 Encounters:  03/25/22 171 lb 0.6 oz (77.6 kg)  12/17/21 173 lb 6.4 oz (78.7 kg)  12/16/20 170 lb (77.1 kg)    Lab Results  Component Value Date   TSH 0.669 12/16/2020   Lab Results  Component Value Date   WBC 3.9 12/16/2020   HGB 12.2 12/16/2020   HCT 38.5 12/16/2020   MCV 87 12/16/2020   PLT 259 12/16/2020   Lab Results  Component Value Date   NA 138 12/16/2020   K 4.6 12/16/2020   CO2 20 12/16/2020   GLUCOSE 85 12/16/2020   BUN 11 12/16/2020   CREATININE 0.70 12/16/2020   BILITOT <0.2 12/16/2020   ALKPHOS 74 12/16/2020   AST 9 12/16/2020   ALT 12 12/16/2020   PROT 7.0  12/16/2020   ALBUMIN 4.2 12/16/2020   CALCIUM 9.0 12/16/2020   EGFR 125 12/16/2020   Lab Results  Component Value Date   CHOL 189 12/16/2020   Lab Results  Component Value Date   HDL 70  12/16/2020   Lab Results  Component Value Date   LDLCALC 107 (H) 12/16/2020   Lab Results  Component Value Date   TRIG 66 12/16/2020   No results found for: "CHOLHDL" No results found for: "HGBA1C"    Assessment & Plan:   Problem List Items Addressed This Visit       Other   Cervical cancer screening - Primary    - According to the [place], cytology and HPV co-testing (preferred) every 5 years or cytology alone (acceptable) every 3 years. - Pap due 2026 /2028      Other Visit Diagnoses     Encounter for Papanicolaou smear for cervical cancer screening       Relevant Orders   Cytology - PAP   Immunization due       Relevant Orders   Flu Vaccine QUAD 6+ mos PF IM (Fluarix Quad PF) (Completed)   Vitamin D deficiency       Relevant Orders   Vitamin D (25 hydroxy)   IFG (impaired fasting glucose)       Relevant Orders   CBC with Differential/Platelet   CMP14+EGFR   TSH + free T4   Hemoglobin A1C   Lipid panel       No orders of the defined types were placed in this encounter.   Follow-up: Return in about 3 months (around 06/24/2022) for CPE.    Alvira Monday, FNP

## 2022-03-25 NOTE — Patient Instructions (Addendum)
I appreciate the opportunity to provide care to you today!    Follow up:  3 months CPE  Fasting Labs: 2-3 days before next appt  Will will contact you with your pap results     Please continue to a heart-healthy diet and increase your physical activities. Try to exercise for at least three times a week.      It was a pleasure to see you and I look forward to continuing to work together on your health and well-being. Please do not hesitate to call the office if you need care or have questions about your care.   Have a wonderful day and week. With Gratitude, Gilmore Laroche MSN, FNP-BC

## 2022-04-01 LAB — CYTOLOGY - PAP
Chlamydia: NEGATIVE
Comment: NEGATIVE
Comment: NEGATIVE
Comment: NEGATIVE
Comment: NORMAL
Diagnosis: NEGATIVE
High risk HPV: NEGATIVE
Neisseria Gonorrhea: NEGATIVE
Trichomonas: NEGATIVE

## 2022-04-02 ENCOUNTER — Other Ambulatory Visit: Payer: Self-pay | Admitting: Family Medicine

## 2022-04-02 DIAGNOSIS — B9689 Other specified bacterial agents as the cause of diseases classified elsewhere: Secondary | ICD-10-CM

## 2022-04-02 MED ORDER — METRONIDAZOLE 500 MG PO TABS: 500.0000 mg | ORAL_TABLET | Freq: Two times a day (BID) | ORAL | 0 refills | Status: AC

## 2022-04-02 NOTE — Progress Notes (Signed)
Please inform the patient that her pap was negative. This means that no changes were found on her cervix.  She does have BV and a prescription for Flagyl has been sent to the pharmacy.

## 2022-05-01 ENCOUNTER — Encounter: Payer: Self-pay | Admitting: Family Medicine

## 2022-05-03 NOTE — Telephone Encounter (Signed)
Encourage [facility]

## 2022-06-24 ENCOUNTER — Ambulatory Visit (INDEPENDENT_AMBULATORY_CARE_PROVIDER_SITE_OTHER): Payer: BC Managed Care – PPO | Admitting: Family Medicine

## 2022-06-24 ENCOUNTER — Encounter: Payer: Self-pay | Admitting: Family Medicine

## 2022-06-24 VITALS — BP 130/85 | HR 84 | Ht 63.0 in | Wt 169.1 lb

## 2022-06-24 DIAGNOSIS — E049 Nontoxic goiter, unspecified: Secondary | ICD-10-CM

## 2022-06-24 DIAGNOSIS — Z0001 Encounter for general adult medical examination with abnormal findings: Secondary | ICD-10-CM | POA: Insufficient documentation

## 2022-06-24 DIAGNOSIS — I10 Essential (primary) hypertension: Secondary | ICD-10-CM | POA: Diagnosis not present

## 2022-06-24 DIAGNOSIS — Z23 Encounter for immunization: Secondary | ICD-10-CM | POA: Diagnosis not present

## 2022-06-24 DIAGNOSIS — Z118 Encounter for screening for other infectious and parasitic diseases: Secondary | ICD-10-CM

## 2022-06-24 LAB — CMP14+EGFR
ALT: 11 IU/L (ref 0–32)
AST: 11 IU/L (ref 0–40)
Albumin/Globulin Ratio: 1.8 (ref 1.2–2.2)
Albumin: 4.3 g/dL (ref 4.0–5.0)
Alkaline Phosphatase: 78 IU/L (ref 44–121)
BUN/Creatinine Ratio: 14 (ref 9–23)
BUN: 12 mg/dL (ref 6–20)
Bilirubin Total: 0.3 mg/dL (ref 0.0–1.2)
CO2: 22 mmol/L (ref 20–29)
Calcium: 9.2 mg/dL (ref 8.7–10.2)
Chloride: 104 mmol/L (ref 96–106)
Creatinine, Ser: 0.85 mg/dL (ref 0.57–1.00)
Globulin, Total: 2.4 g/dL (ref 1.5–4.5)
Glucose: 94 mg/dL (ref 70–99)
Potassium: 4 mmol/L (ref 3.5–5.2)
Sodium: 139 mmol/L (ref 134–144)
Total Protein: 6.7 g/dL (ref 6.0–8.5)
eGFR: 99 mL/min/{1.73_m2} (ref >59)

## 2022-06-24 LAB — VITAMIN D 25 HYDROXY (VIT D DEFICIENCY, FRACTURES): Vit D, 25-Hydroxy: 37.8 ng/mL (ref 30.0–100.0)

## 2022-06-24 LAB — LIPID PANEL
Chol/HDL Ratio: 3.4 ratio (ref 0.0–4.4)
Cholesterol, Total: 170 mg/dL (ref 100–199)
HDL: 50 mg/dL (ref >39)
LDL Chol Calc (NIH): 110 mg/dL — ABNORMAL HIGH (ref 0–99)
Triglycerides: 48 mg/dL (ref 0–149)
VLDL Cholesterol Cal: 10 mg/dL (ref 5–40)

## 2022-06-24 LAB — CBC WITH DIFFERENTIAL/PLATELET
Basophils Absolute: 0 10*3/uL (ref 0.0–0.2)
Basos: 1 %
EOS (ABSOLUTE): 0.1 10*3/uL (ref 0.0–0.4)
Eos: 4 %
Hematocrit: 40.2 % (ref 34.0–46.6)
Hemoglobin: 12.8 g/dL (ref 11.1–15.9)
Immature Grans (Abs): 0 10*3/uL (ref 0.0–0.1)
Immature Granulocytes: 0 %
Lymphocytes Absolute: 1.7 10*3/uL (ref 0.7–3.1)
Lymphs: 42 %
MCH: 28.2 pg (ref 26.6–33.0)
MCHC: 31.8 g/dL (ref 31.5–35.7)
MCV: 89 fL (ref 79–97)
Monocytes Absolute: 0.4 10*3/uL (ref 0.1–0.9)
Monocytes: 10 %
Neutrophils Absolute: 1.8 10*3/uL (ref 1.4–7.0)
Neutrophils: 43 %
Platelets: 226 10*3/uL (ref 150–450)
RBC: 4.54 x10E6/uL (ref 3.77–5.28)
RDW: 13.2 % (ref 11.7–15.4)
WBC: 4 10*3/uL (ref 3.4–10.8)

## 2022-06-24 LAB — TSH+FREE T4
Free T4: 1.38 ng/dL (ref 0.82–1.77)
TSH: 1.24 u[IU]/mL (ref 0.450–4.500)

## 2022-06-24 LAB — HEMOGLOBIN A1C
Est. average glucose Bld gHb Est-mCnc: 117 mg/dL
Hgb A1c MFr Bld: 5.7 % — ABNORMAL HIGH (ref 4.8–5.6)

## 2022-06-24 NOTE — Patient Instructions (Addendum)
I appreciate the opportunity to provide care to you today!    Follow up:  4 months    Please stop by your local pharmacy and get your Tdap and Shingles vaccine  Referrals today-    Please continue to a heart-healthy diet and increase your physical activities. Try to exercise for at least three times a week.      It was a pleasure to see you and I look forward to continuing to work together on your health and well-being. Please do not hesitate to call the office if you need care or have questions about your care.   Have a wonderful day and week. With Gratitude, Gilmore Laroche MSN, FNP-BC

## 2022-06-24 NOTE — Progress Notes (Signed)
Complete physical exam  Patient: Hannah Sampson   DOB: 05/03/2001   21 y.o. Female  MRN: [id number]  Subjective:    Chief Complaint  Patient presents with   Annual Exam    Cpe today. No health concerns today.     Hannah Sampson is a 21 y.o. female who presents today for a complete physical exam. She reports consuming a general diet.  The patient engaged and physical activity sometimes not regularly She generally feels well. She reports sleeping well. She does not have additional problems to discuss today.    Most recent fall risk assessment:    06/24/2022    9:49 AM  [place] in the past year? 0  Number falls in past yr: 0  Injury with Fall? 0  Risk for fall due to : No Fall Risks  Follow up Falls evaluation completed     Most recent depression screenings:    06/24/2022    9:49 AM 03/25/2022    9:35 AM  PHQ 2/9 Scores  PHQ - 2 Score 0 0  PHQ- 9 Score 0     Dental: No current dental problems and Last dental visit: July 2023  Patient Active Problem List   Diagnosis Date Noted   Encounter for annual general medical examination with abnormal findings in adult 06/24/2022   Cervical cancer screening 03/25/2022   Encounter for general adult medical examination with abnormal findings 12/16/2020   Enlarged thyroid gland 12/16/2020   Hypertension, essential 10/22/2020   Heart murmur 11/20/2013   Past Medical History:  Diagnosis Date   Anemia    COVID 10/22/2020   History reviewed. No pertinent surgical history. Social History   Tobacco Use   Smoking status: Never   Smokeless tobacco: Never  Vaping Use   Vaping Use: Never used  Substance Use Topics   Alcohol use: No   Drug use: No   Social History   Socioeconomic History   Marital status: Single    Spouse name: Not on file   Number of children: Not on file   Years of education: Not on file   Highest education level: Some college, no degree  Occupational History   Not on file  Tobacco Use    Smoking status: Never   Smokeless tobacco: Never  Vaping Use   Vaping Use: Never used  Substance and Sexual Activity   Alcohol use: No   Drug use: No   Sexual activity: Yes    Birth control/protection: Pill, Condom  Other Topics Concern   Not on file  Social History Narrative   Lives with mom, dad and brother   No pets       Enjoys reading-fiction favorite Chiropractor, enjoys working to get outside house      Diet: meat, veggies, fruits (red meats, not big sweets, chips yes)   Caffeine: soda week   Water: 4 bottles daily      Wear seat belt    Wear sunscreen   Smoke detectors at home     Does not use phone while driving    Social Determinants of Health   Financial Resource Strain: Low Risk  (02/07/2019)   Overall Financial Resource Strain (CARDIA)    Difficulty of Paying Living Expenses: Not hard at all  Food Insecurity: No Food Insecurity (02/07/2019)   Hunger Vital Sign    Worried About Running Out of Food in the Last Year: Never true    [place] in  the Last Year: Never true  Transportation Needs: No Transportation Needs (02/07/2019)   PRAPARE - Hydrologist (Medical): No    Lack of Transportation (Non-Medical): No  Physical Activity: Inactive (02/07/2019)   Exercise Vital Sign    Days of Exercise per Week: 0 days    Minutes of Exercise per Session: 0 min  Stress: No Stress Concern Present (02/07/2019)   [place]    Feeling of Stress : Not at all  Social Connections: Moderately Integrated (02/07/2019)   Social Connection and Isolation Panel [NHANES]    Frequency of Communication with Friends and Family: More than three times a week    Frequency of Social Gatherings with Friends and Family: Three times a week    Attends Religious Services: More than 4 times per year    Active Member of Clubs or Organizations: Yes    Attends Archivist Meetings:  Never    Marital Status: Never married  Intimate Partner Violence: Not At Risk (02/07/2019)   Humiliation, Afraid, Rape, and Kick questionnaire    Fear of Current or Ex-Partner: No    Emotionally Abused: No    Physically Abused: No    Sexually Abused: No   Family Status  Relation Name Status   Mother  Alive   Father  Alive   Brother  Alive   PGF  Alive   [place]  Alive   MGM  Deceased   MGF  Alive   Family History  Problem Relation Age of Onset   Hypertension Father    Diabetes Paternal Grandfather    Diabetes Maternal Grandmother    No Known Allergies    Patient Care Team: Alvira Monday, FNP as PCP - General (Family Medicine)   Outpatient Medications Prior to Visit  Medication Sig   amLODipine (NORVASC) 10 MG tablet Take 1 tablet (10 mg total) by mouth daily.   DEPO-SUBQ PROVERA 104 104 MG/0.65ML injection Inject into the skin.   naproxen sodium (ALEVE) 220 MG tablet Take 440 mg by mouth once as needed (for pain).   [DISCONTINUED] norgestimate-ethinyl estradiol (ORTHO-CYCLEN) 0.25-35 MG-MCG tablet Take 1 tablet by mouth daily.   No facility-administered medications prior to visit.    Review of Systems  Constitutional:  Negative for chills, fever and malaise/fatigue.  HENT:  Negative for congestion and sinus pain.   Eyes:  Negative for pain, discharge and redness.  Respiratory:  Negative for cough, sputum production and shortness of breath.   Cardiovascular:  Negative for chest pain, palpitations, claudication and leg swelling.  Gastrointestinal:  Negative for diarrhea, heartburn and nausea.  Genitourinary:  Negative for flank pain and frequency.  Musculoskeletal:  Negative for back pain and joint pain.  Skin:  Negative for itching.  Neurological:  Negative for dizziness, seizures and headaches.  Endo/Heme/Allergies:  Negative for environmental allergies.  Psychiatric/Behavioral:  Negative for memory loss. The patient does not have insomnia.        Objective:     BP 130/85   Pulse 84   Ht _0  (1.6 m)   Wt 169 lb 1.3 oz (76.7 kg)   LMP 06/18/2022   SpO2 98%   BMI 29.95 kg/m  BP Readings from Last 3 Encounters:  06/24/22 130/85  03/25/22 126/78  12/17/21 118/72   Wt Readings from Last 3 Encounters:  06/24/22 169 lb 1.3 oz (76.7 kg)  03/25/22 171 lb 0.6 oz (77.6 kg)  12/17/21 173 lb  6.4 oz (78.7 kg)      Physical Exam Constitutional:      General: She is not in acute distress. HENT:     Head: Normocephalic.     Right Ear: External ear normal.     Left Ear: External ear normal.     Mouth/Throat:     Mouth: Mucous membranes are moist.  Eyes:     Extraocular Movements: Extraocular movements intact.     Pupils: Pupils are equal, round, and reactive to light.  Cardiovascular:     Rate and Rhythm: Normal rate.     Heart sounds: Normal heart sounds.  Pulmonary:     Effort: Pulmonary effort is normal.     Breath sounds: Normal breath sounds.  Abdominal:     Palpations: Abdomen is soft.     Tenderness: There is no right CVA tenderness or left CVA tenderness.  Musculoskeletal:     Cervical back: No rigidity.     Right lower leg: No edema.  Skin:    Findings: No bruising or erythema.  Neurological:     Mental Status: She is alert and oriented to person, place, and time.     GCS: GCS eye subscore is 4. GCS verbal subscore is 5. GCS motor subscore is 6.     Cranial Nerves: No cranial nerve deficit or facial asymmetry.     Motor: No atrophy or seizure activity.     Coordination: Coordination normal. Finger-Nose-Finger Test normal.     Gait: Gait normal.  Psychiatric:        Judgment: Judgment normal.     No results found for any visits on 06/24/22. Last CBC Lab Results  Component Value Date   WBC 4.0 06/23/2022   HGB 12.8 06/23/2022   HCT 40.2 06/23/2022   MCV 89 06/23/2022   MCH 28.2 06/23/2022   RDW 13.2 06/23/2022   PLT 226 37/90/2409   Last metabolic panel Lab Results  Component Value Date   GLUCOSE 94  06/23/2022   NA 139 06/23/2022   K 4.0 06/23/2022   CL 104 06/23/2022   CO2 22 06/23/2022   BUN 12 06/23/2022   CREATININE 0.85 06/23/2022   EGFR 99 06/23/2022   CALCIUM 9.2 06/23/2022   PROT 6.7 06/23/2022   ALBUMIN 4.3 06/23/2022   LABGLOB 2.4 06/23/2022   AGRATIO 1.8 06/23/2022   BILITOT 0.3 06/23/2022   ALKPHOS 78 06/23/2022   AST 11 06/23/2022   ALT 11 06/23/2022   Last lipids Lab Results  Component Value Date   CHOL 170 06/23/2022   HDL 50 06/23/2022   LDLCALC 110 (H) 06/23/2022   TRIG 48 06/23/2022   CHOLHDL 3.4 06/23/2022   Last hemoglobin A1c Lab Results  Component Value Date   HGBA1C 5.7 (H) 06/23/2022   Last thyroid functions Lab Results  Component Value Date   TSH 1.240 06/23/2022   Last vitamin D Lab Results  Component Value Date   VD25OH 37.8 06/23/2022   Last vitamin B12 and Folate No results found for: "VITAMINB12", "FOLATE"      Assessment & Plan:    Routine Health Maintenance and Physical Exam  Immunization History  Administered Date(s) Administered   HPV 9-valent 12/17/2021, 06/24/2022   Influenza Inj Mdck Quad Pf 04/17/2019   Influenza,inj,Quad PF,6+ Mos 04/26/2017, 04/17/2018, 03/25/2022   Influenza-Unspecified 04/17/2019   Moderna Sars-Covid-2 Vaccination 09/21/2019, 10/19/2019   Tdap 12/17/2021    Health Maintenance  Topic Date Due   CHLAMYDIA SCREENING  10/22/2021   COVID-19  Vaccine (3 - 2023-24 season) 03/19/2022   HPV VACCINES (3 - 3-dose series) 10/24/2022   PAP-Cervical Cytology Screening  03/25/2025   PAP SMEAR-Modifier  03/25/2025   DTaP/Tdap/Td (2 - Td or Tdap) 12/18/2031   INFLUENZA VACCINE  Completed   Hepatitis C Screening  Completed   HIV Screening  Completed    Discussed health benefits of physical activity, and encouraged her to engage in regular exercise appropriate for her age and condition.  Encounter for annual general medical examination with abnormal findings in adult Assessment & Plan: Physical  exam as documented Counseling is done on healthy lifestyle involving commitment to 150 minutes of exercise per week,  Discussed heart-healthy diet  She received her HPV vaccination today The patient is up-to-date with her immunization       Hypertension, essential  Enlarged thyroid gland  Encounter for screening examination for chlamydial infection -     GC/Chlamydia Probe Amp  Immunization due -     HPV 9-valent vaccine,Recombinat    No follow-ups on file.     Alvira Monday, FNP

## 2022-06-24 NOTE — Assessment & Plan Note (Addendum)
Physical exam as documented Counseling is done on healthy lifestyle involving commitment to 150 minutes of exercise per week,  Discussed heart-healthy diet  She received her HPV vaccination today The patient is up-to-date with her immunization

## 2022-06-28 LAB — GC/CHLAMYDIA PROBE AMP
Chlamydia trachomatis, NAA: NEGATIVE
Neisseria Gonorrhoeae by PCR: NEGATIVE

## 2022-09-30 ENCOUNTER — Encounter: Payer: Self-pay | Admitting: Family Medicine

## 2022-09-30 ENCOUNTER — Other Ambulatory Visit: Payer: Self-pay | Admitting: Family Medicine

## 2022-09-30 DIAGNOSIS — I10 Essential (primary) hypertension: Secondary | ICD-10-CM

## 2022-10-01 NOTE — Telephone Encounter (Signed)
Please schedule the patient to see me next week

## 2022-10-05 ENCOUNTER — Ambulatory Visit (INDEPENDENT_AMBULATORY_CARE_PROVIDER_SITE_OTHER): Payer: BC Managed Care – PPO | Admitting: Family Medicine

## 2022-10-05 ENCOUNTER — Encounter: Payer: Self-pay | Admitting: Family Medicine

## 2022-10-05 VITALS — BP 126/86 | HR 88 | Ht 63.0 in | Wt 172.1 lb

## 2022-10-05 DIAGNOSIS — N939 Abnormal uterine and vaginal bleeding, unspecified: Secondary | ICD-10-CM

## 2022-10-05 DIAGNOSIS — Z23 Encounter for immunization: Secondary | ICD-10-CM

## 2022-10-05 DIAGNOSIS — N920 Excessive and frequent menstruation with regular cycle: Secondary | ICD-10-CM | POA: Diagnosis not present

## 2022-10-05 MED ORDER — MEGESTROL ACETATE 20 MG PO TABS: 20.0000 mg | ORAL_TABLET | Freq: Two times a day (BID) | ORAL | 0 refills | Status: AC

## 2022-10-05 NOTE — Patient Instructions (Addendum)
I appreciate the opportunity to provide care to you today!    Follow up:  2 weeks  Labs: please stop by the lab today to get your blood drawn (CBC)   Please pick up your medication at the pharmacy    Please continue to a heart-healthy diet and increase your physical activities. Try to exercise for 44mins at least five days a week.      It was a pleasure to see you and I look forward to continuing to work together on your health and well-being. Please do not hesitate to call the office if you need care or have questions about your care.   Have a wonderful day and week. With Gratitude, Alvira Monday MSN, FNP-BC

## 2022-10-05 NOTE — Assessment & Plan Note (Addendum)
Complains of abnormal uterine bleeding for 3 weeks, noting that she has been on her cycle with heavy menstrual bleeding for 3 weeks Reports menstrual cramps at the beginning of her cycle which has subsided The patient has regular menses that typically last 3 to 4 days No family history or history of fibroids No evidence of anemia Will get CBC today, transvaginal ultrasound to rule out uterine fibroids and endometrial polyps Will treat with Megace 20 mg twice daily for 10 days

## 2022-10-05 NOTE — Progress Notes (Signed)
Established Patient Office Visit  Subjective:  Patient ID: Hannah Sampson, female    DOB: Jul 06, 2001  Age: 22 y.o. MRN: [id number]  CC:  Chief Complaint  Patient presents with   Follow-up    4 month f/u, pt reports non stop cycle for the past four weeks (09/07/22)    HPI Hannah Sampson is a 22 y.o. female with past medical history hypertension and enlarged thyroid gland of  presents with complaints of abnormal uterine bleeding for 3 weeks.   Past Medical History:  Diagnosis Date   Anemia    COVID 10/22/2020    History reviewed. No pertinent surgical history.  Family History  Problem Relation Age of Onset   Hypertension Father    Diabetes Paternal Grandfather    Diabetes Maternal Grandmother     Social History   Socioeconomic History   Marital status: Single    Spouse name: Not on file   Number of children: Not on file   Years of education: Not on file   Highest education level: Some college, no degree  Occupational History   Not on file  Tobacco Use   Smoking status: Never   Smokeless tobacco: Never  Vaping Use   Vaping Use: Never used  Substance and Sexual Activity   Alcohol use: No   Drug use: No   Sexual activity: Yes    Birth control/protection: Pill, Condom  Other Topics Concern   Not on file  Social History Narrative   Lives with mom, dad and brother   No pets       Enjoys reading-fiction favorite Artist, enjoys working to get outside house      Diet: meat, veggies, fruits (red meats, not big sweets, chips yes)   Caffeine: soda week   Water: 4 bottles daily      Wear seat belt    Wear sunscreen   Smoke detectors at home     Does not use phone while driving    Social Determinants of Health   Financial Resource Strain: Low Risk  (02/07/2019)   Overall Financial Resource Strain (CARDIA)    Difficulty of Paying Living Expenses: Not hard at all  Food Insecurity: No Food Insecurity (02/07/2019)   Hunger Vital Sign    Worried  About Running Out of Food in the Last Year: Never true    Ran Out of Food in the Last Year: Never true  Transportation Needs: No Transportation Needs (02/07/2019)   PRAPARE - Administrator, Civil Service (Medical): No    Lack of Transportation (Non-Medical): No  Physical Activity: Inactive (02/07/2019)   Exercise Vital Sign    Days of Exercise per Week: 0 days    Minutes of Exercise per Session: 0 min  Stress: No Stress Concern Present (02/07/2019)   [employer] of Occupational Health - Occupational Stress Questionnaire    Feeling of Stress : Not at all  Social Connections: Moderately Integrated (02/07/2019)   Social Connection and Isolation Panel [NHANES]    Frequency of Communication with Friends and Family: More than three times a week    Frequency of Social Gatherings with Friends and Family: Three times a week    Attends Religious Services: More than 4 times per year    Active Member of Clubs or Organizations: Yes    Attends Banker Meetings: Never    Marital Status: Never married  Intimate Partner Violence: Not At Risk (02/07/2019)   Humiliation, Afraid, Rape, and Kick questionnaire  Fear of Current or Ex-Partner: No    Emotionally Abused: No    Physically Abused: No    Sexually Abused: No    Outpatient Medications Prior to Visit  Medication Sig Dispense Refill   amLODipine (NORVASC) 10 MG tablet TAKE 1 TABLET(10 MG) BY MOUTH DAILY 90 tablet 1   DEPO-SUBQ PROVERA 104 104 MG/0.65ML injection Inject into the skin.     naproxen sodium (ALEVE) 220 MG tablet Take 440 mg by mouth once as needed (for pain).     No facility-administered medications prior to visit.    No Known Allergies  ROS Review of Systems  Constitutional:  Negative for chills and fever.  Eyes:  Negative for visual disturbance.  Respiratory:  Negative for chest tightness and shortness of breath.   Genitourinary:  Positive for menstrual problem.  Neurological:  Negative for  dizziness and headaches.      Objective:    Physical Exam HENT:     Head: Normocephalic.     Mouth/Throat:     Mouth: Mucous membranes are moist.  Cardiovascular:     Rate and Rhythm: Normal rate.     Heart sounds: Normal heart sounds.  Pulmonary:     Effort: Pulmonary effort is normal.     Breath sounds: Normal breath sounds.  Neurological:     Mental Status: She is alert.     BP 126/86   Pulse 88   Ht 5\' 3"  (1.6 m)   Wt 172 lb 1.3 oz (78.1 kg)   SpO2 98%   BMI 30.48 kg/m  Wt Readings from Last 3 Encounters:  10/05/22 172 lb 1.3 oz (78.1 kg)  06/24/22 169 lb 1.3 oz (76.7 kg)  03/25/22 171 lb 0.6 oz (77.6 kg)    Lab Results  Component Value Date   TSH 1.240 06/23/2022   Lab Results  Component Value Date   WBC 4.0 06/23/2022   HGB 12.8 06/23/2022   HCT 40.2 06/23/2022   MCV 89 06/23/2022   PLT 226 06/23/2022   Lab Results  Component Value Date   NA 139 06/23/2022   K 4.0 06/23/2022   CO2 22 06/23/2022   GLUCOSE 94 06/23/2022   BUN 12 06/23/2022   CREATININE 0.85 06/23/2022   BILITOT 0.3 06/23/2022   ALKPHOS 78 06/23/2022   AST 11 06/23/2022   ALT 11 06/23/2022   PROT 6.7 06/23/2022   ALBUMIN 4.3 06/23/2022   CALCIUM 9.2 06/23/2022   EGFR 99 06/23/2022   Lab Results  Component Value Date   CHOL 170 06/23/2022   Lab Results  Component Value Date   HDL 50 06/23/2022   Lab Results  Component Value Date   LDLCALC 110 (H) 06/23/2022   Lab Results  Component Value Date   TRIG 48 06/23/2022   Lab Results  Component Value Date   CHOLHDL 3.4 06/23/2022   Lab Results  Component Value Date   HGBA1C 5.7 (H) 06/23/2022      Assessment & Plan:  Abnormal uterine bleeding (AUB) Assessment & Plan: Complains of abnormal uterine bleeding for 3 weeks, noting that she has been on her cycle with heavy menstrual bleeding  Reports menstrual cramps at the beginning of her cycle, which have subsided The patient has regular menses that typically last  3 to 4 days No family history or history of fibroids No evidence of anemia Will get a CBC today and a transvaginal ultrasound to rule out uterine fibroids and endometrial polyps Will treat with Megace 20 mg  twice daily for 10 days   Orders: -     US PELVIC COMPLETE WITH TRANSVAGINAL  Menorrhagia with regular cycle -     Megestrol Acetate; Take 1 tablet (20 mg total) by mouth 2 (two) times daily for 10 days.  Dispense: 20 tablet; Refill: 0 -     US PELVIC COMPLETE WITH TRANSVAGINAL -     CBC with Differential/Platelet  Immunization due -     HPV 9-valent vaccine,Recombinat    Follow-up: Return in about 2 weeks (around 10/19/2022).   Gilmore Laroche, FNP

## 2022-10-06 LAB — CBC WITH DIFFERENTIAL/PLATELET
Basophils Absolute: 0 10*3/uL (ref 0.0–0.2)
Basos: 1 %
EOS (ABSOLUTE): 0.1 10*3/uL (ref 0.0–0.4)
Eos: 2 %
Hematocrit: 40.4 % (ref 34.0–46.6)
Hemoglobin: 13.5 g/dL (ref 11.1–15.9)
Immature Grans (Abs): 0 10*3/uL (ref 0.0–0.1)
Immature Granulocytes: 0 %
Lymphocytes Absolute: 1.4 10*3/uL (ref 0.7–3.1)
Lymphs: 40 %
MCH: 30 pg (ref 26.6–33.0)
MCHC: 33.4 g/dL (ref 31.5–35.7)
MCV: 90 fL (ref 79–97)
Monocytes Absolute: 0.3 10*3/uL (ref 0.1–0.9)
Monocytes: 8 %
Neutrophils Absolute: 1.7 10*3/uL (ref 1.4–7.0)
Neutrophils: 49 %
Platelets: 255 10*3/uL (ref 150–450)
RBC: 4.5 x10E6/uL (ref 3.77–5.28)
RDW: 12 % (ref 11.7–15.4)
WBC: 3.4 10*3/uL (ref 3.4–10.8)

## 2022-10-13 ENCOUNTER — Ambulatory Visit (HOSPITAL_COMMUNITY)
Admission: RE | Admit: 2022-10-13 | Discharge: 2022-10-13 | Disposition: A | Discharge status: Home / Self Care | Payer: BC Managed Care – PPO | Source: Ambulatory Visit | Attending: Family Medicine | Admitting: Family Medicine

## 2022-10-13 ENCOUNTER — Other Ambulatory Visit: Payer: Self-pay | Admitting: Family Medicine

## 2022-10-13 DIAGNOSIS — N920 Excessive and frequent menstruation with regular cycle: Secondary | ICD-10-CM | POA: Diagnosis present

## 2022-10-13 DIAGNOSIS — N939 Abnormal uterine and vaginal bleeding, unspecified: Secondary | ICD-10-CM | POA: Insufficient documentation

## 2022-10-19 ENCOUNTER — Ambulatory Visit (INDEPENDENT_AMBULATORY_CARE_PROVIDER_SITE_OTHER): Payer: BC Managed Care – PPO | Admitting: Family Medicine

## 2022-10-19 VITALS — BP 128/82 | HR 78 | Ht 63.0 in | Wt 169.1 lb

## 2022-10-19 DIAGNOSIS — N939 Abnormal uterine and vaginal bleeding, unspecified: Secondary | ICD-10-CM | POA: Diagnosis not present

## 2022-10-19 NOTE — Progress Notes (Signed)
Established Patient Office Visit  Subjective:  Patient ID: Hannah Sampson, female    DOB: 13-Feb-2001  Age: 22 y.o. MRN: [id number]  CC:  Chief Complaint  Patient presents with   Follow-up    2 week f/u, pt reports bleeding stopped.     HPI Hannah Sampson is a 22 y.o. female with past medical history of AUB presents for f/u. For the details of today's visit, please refer to the assessment and plan.     Past Medical History:  Diagnosis Date   Anemia    COVID 10/22/2020    History reviewed. No pertinent surgical history.  Family History  Problem Relation Age of Onset   Hypertension Father    Diabetes Paternal Grandfather    Diabetes Maternal Grandmother     Social History   Socioeconomic History   Marital status: Single    Spouse name: Not on file   Number of children: Not on file   Years of education: Not on file   Highest education level: Associate degree: occupational, Hotel manager, or vocational program  Occupational History   Not on file  Tobacco Use   Smoking status: Never   Smokeless tobacco: Never  Vaping Use   Vaping Use: Never used  Substance and Sexual Activity   Alcohol use: No   Drug use: No   Sexual activity: Yes    Birth control/protection: Pill, Condom  Other Topics Concern   Not on file  Social History Narrative   Lives with mom, dad and brother   No pets       Enjoys reading-fiction favorite Chiropractor, enjoys working to get outside house      Diet: meat, veggies, fruits (red meats, not big sweets, chips yes)   Caffeine: soda week   Water: 4 bottles daily      Wear seat belt    Wear sunscreen   Smoke detectors at home     Does not use phone while driving    Social Determinants of Health   Financial Resource Strain: [place]  (10/19/2022)   Overall Financial Resource Strain (CARDIA)    Difficulty of Paying Living Expenses: Not hard at all  Food Insecurity: No Food Insecurity (10/19/2022)   Hunger Vital Sign    Worried  About Running Out of Food in the Last Year: Never true    [place] in the Last Year: Never true  Transportation Needs: No Transportation Needs (10/19/2022)   PRAPARE - Hydrologist (Medical): No    Lack of Transportation (Non-Medical): No  Physical Activity: Insufficiently Active (10/19/2022)   Exercise Vital Sign    Days of Exercise per Week: 3 days    Minutes of Exercise per Session: 20 min  Stress: No Stress Concern Present (10/19/2022)   [place]    Feeling of Stress : Not at all  Social Connections: Moderately Isolated (10/19/2022)   Social Connection and Isolation Panel [NHANES]    Frequency of Communication with Friends and Family: More than three times a week    Frequency of Social Gatherings with Friends and Family: Twice a week    Attends Religious Services: More than 4 times per year    Active Member of [employer] or Organizations: No    Attends Archivist Meetings: Not on file    Marital Status: Never married  Intimate Partner Violence: Not At Risk (02/07/2019)   Humiliation, Afraid, Rape, and  Kick questionnaire    Fear of Current or Ex-Partner: No    Emotionally Abused: No    Physically Abused: No    Sexually Abused: No    Outpatient Medications Prior to Visit  Medication Sig Dispense Refill   amLODipine (NORVASC) 10 MG tablet TAKE 1 TABLET(10 MG) BY MOUTH DAILY 90 tablet 1   DEPO-SUBQ PROVERA 104 104 MG/0.65ML injection Inject into the skin.     naproxen sodium (ALEVE) 220 MG tablet Take 440 mg by mouth once as needed (for pain).     No facility-administered medications prior to visit.    No Known Allergies  ROS Review of Systems  Constitutional:  Negative for chills and fever.  Eyes:  Negative for visual disturbance.  Respiratory:  Negative for chest tightness and shortness of breath.   Neurological:  Negative for dizziness and headaches.      Objective:     Physical Exam HENT:     Head: Normocephalic.     Mouth/Throat:     Mouth: Mucous membranes are moist.  Cardiovascular:     Rate and Rhythm: Normal rate.     Heart sounds: Normal heart sounds.  Pulmonary:     Effort: Pulmonary effort is normal.     Breath sounds: Normal breath sounds.  Neurological:     Mental Status: She is alert.     BP 128/82 (BP Location: Left Arm)   Pulse 78   Ht 5\' 3"  (1.6 m)   Wt 169 lb 1.3 oz (76.7 kg)   SpO2 98%   BMI 29.95 kg/m  Wt Readings from Last 3 Encounters:  10/19/22 169 lb 1.3 oz (76.7 kg)  10/05/22 172 lb 1.3 oz (78.1 kg)  06/24/22 169 lb 1.3 oz (76.7 kg)    Lab Results  Component Value Date   TSH 1.240 06/23/2022   Lab Results  Component Value Date   WBC 3.4 10/05/2022   HGB 13.5 10/05/2022   HCT 40.4 10/05/2022   MCV 90 10/05/2022   PLT 255 10/05/2022   Lab Results  Component Value Date   NA 139 06/23/2022   K 4.0 06/23/2022   CO2 22 06/23/2022   GLUCOSE 94 06/23/2022   BUN 12 06/23/2022   CREATININE 0.85 06/23/2022   BILITOT 0.3 06/23/2022   ALKPHOS 78 06/23/2022   AST 11 06/23/2022   ALT 11 06/23/2022   PROT 6.7 06/23/2022   ALBUMIN 4.3 06/23/2022   CALCIUM 9.2 06/23/2022   EGFR 99 06/23/2022   Lab Results  Component Value Date   CHOL 170 06/23/2022   Lab Results  Component Value Date   HDL 50 06/23/2022   Lab Results  Component Value Date   LDLCALC 110 (H) 06/23/2022   Lab Results  Component Value Date   TRIG 48 06/23/2022   Lab Results  Component Value Date   CHOLHDL 3.4 06/23/2022   Lab Results  Component Value Date   HGBA1C 5.7 (H) 06/23/2022      Assessment & Plan:  Abnormal uterine bleeding (AUB) Assessment & Plan: Stable Reports that her bleeding has subsided Informed the patient to inform me if her symptoms continue Reviewed negative pelvic U/S with the patient Will consider a gyn referral if her symptoms persist      Follow-up: Return in about 3 months (around  01/18/2023).   Alvira Monday, FNP

## 2022-10-19 NOTE — Patient Instructions (Signed)
I appreciate the opportunity to provide care to you today!    Follow up:  3 months   Please continue to a heart-healthy diet and increase your physical activities. Try to exercise for 30mins at least five days a week.      It was a pleasure to see you and I look forward to continuing to work together on your health and well-being. Please do not hesitate to call the office if you need care or have questions about your care.   Have a wonderful day and week. With Gratitude, Joevanni Roddey MSN, FNP-BC  

## 2022-10-19 NOTE — Assessment & Plan Note (Signed)
Stable Reports that her bleeding has subsided Informed the patient to inform me if her symptoms continue Reviewed negative pelvic U/S with the patient Will consider a gyn referral if her symptoms persist

## 2022-10-27 ENCOUNTER — Ambulatory Visit: Payer: BC Managed Care – PPO | Admitting: Family Medicine

## 2022-10-27 ENCOUNTER — Ambulatory Visit
Admission: RE | Admit: 2022-10-27 | Discharge: 2022-10-27 | Disposition: A | Discharge status: Home / Self Care | Payer: 59 | Source: Ambulatory Visit | Attending: Family Medicine | Admitting: Family Medicine

## 2022-10-27 ENCOUNTER — Other Ambulatory Visit: Payer: Self-pay

## 2022-10-27 VITALS — BP 133/88 | HR 89 | Temp 98.2°F | Resp 20

## 2022-10-27 DIAGNOSIS — J040 Acute laryngitis: Secondary | ICD-10-CM

## 2022-10-27 NOTE — ED Triage Notes (Signed)
Pt reports lost voice on Sunday, runny nose, and bilateral cheek pain for last few days. Denies any know fevers.

## 2022-10-27 NOTE — ED Provider Notes (Signed)
[facility]   [id number] 10/27/22 Arrival Time: 0944  ASSESSMENT & PLAN:  1. Laryngitis, acute    See AVS for d/c information on laryngitis. Rest voice. OTC throat soothing medications as needed.   Follow-up Information     Gilmore Laroche, FNP.   Specialty: Family Medicine Why: If worsening or failing to improve as anticipated. Contact information: [address] [place] [address] [phone]                 Reviewed expectations re: course of current medical issues. Questions answered. Outlined signs and symptoms indicating need for more acute intervention. Understanding verbalized. After Visit Summary given.   SUBJECTIVE: History from: Patient. Hannah Sampson is a 22 y.o. female. Reports: hoarse voice; x 2 days; a little better today; mild nasal congestion. Denies: fever, cough, sore throat, and difficulty breathing. Normal PO intake without n/v/d.  Social History   Tobacco Use  Smoking Status Never  Smokeless Tobacco Never    OBJECTIVE:  Vitals:   10/27/22 1008  BP: 133/88  Pulse: 89  Resp: 20  Temp: 98.2 F (36.8 C)  TempSrc: Oral  SpO2: 98%    General appearance: alert; no distress Eyes: PERRLA; EOMI; conjunctiva normal HENT: [place]; AT; with mild nasal congestion; throat with mild cobblestoning/erythema Neck: supple  Lungs: speaks full sentences without difficulty; unlabored Extremities: no edema Skin: warm and dry Neurologic: normal gait Psychological: alert and cooperative; normal mood and affect    No Known Allergies  Past Medical History:  Diagnosis Date   Anemia    COVID 10/22/2020   Social History   Socioeconomic History   Marital status: Single    Spouse name: Not on file   Number of children: Not on file   Years of education: Not on file   Highest education level: Associate degree: occupational, Scientist, product/process development, or vocational program  Occupational History   Not on file  Tobacco Use   Smoking status:  Never   Smokeless tobacco: Never  Vaping Use   Vaping Use: Never used  Substance and Sexual Activity   Alcohol use: No   Drug use: No   Sexual activity: Yes    Birth control/protection: Condom, Injection  Other Topics Concern   Not on file  Social History Narrative   Lives with mom, dad and brother   No pets       Enjoys reading-fiction favorite Artist, enjoys working to get outside house      Diet: meat, veggies, fruits (red meats, not big sweets, chips yes)   Caffeine: soda week   Water: 4 bottles daily      Wear seat belt    Wear sunscreen   Smoke detectors at home     Does not use phone while driving    Social Determinants of Health   Financial Resource Strain: Low Risk  (10/19/2022)   Overall Financial Resource Strain (CARDIA)    Difficulty of Paying Living Expenses: Not hard at all  Food Insecurity: No Food Insecurity (10/19/2022)   Hunger Vital Sign    Worried About Running Out of Food in the Last Year: Never true    Ran Out of Food in the Last Year: Never true  Transportation Needs: No Transportation Needs (10/19/2022)   PRAPARE - Administrator, Civil Service (Medical): No    Lack of Transportation (Non-Medical): No  Physical Activity: Insufficiently Active (10/19/2022)   Exercise Vital Sign    Days of Exercise per Week:  3 days    Minutes of Exercise per Session: 20 min  Stress: No Stress Concern Present (10/19/2022)   [employer] of Occupational Health - Occupational Stress Questionnaire    Feeling of Stress : Not at all  Social Connections: Moderately Isolated (10/19/2022)   Social Connection and Isolation Panel [NHANES]    Frequency of Communication with Friends and Family: More than three times a week    Frequency of Social Gatherings with Friends and Family: Twice a week    Attends Religious Services: More than 4 times per year    Active Member of [employer] or Organizations: No    Attends Engineer, structural: Not on file     Marital Status: Never married  Intimate Partner Violence: Not At Risk (02/07/2019)   Humiliation, Afraid, Rape, and Kick questionnaire    Fear of Current or Ex-Partner: No    Emotionally Abused: No    Physically Abused: No    Sexually Abused: No   Family History  Problem Relation Age of Onset   Hypertension Father    Diabetes Paternal Grandfather    Diabetes Maternal Grandmother    History reviewed. No pertinent surgical history.   Mardella Layman, MD 10/27/22 1030

## 2022-12-21 ENCOUNTER — Other Ambulatory Visit (HOSPITAL_COMMUNITY)
Admission: RE | Admit: 2022-12-21 | Discharge: 2022-12-21 | Disposition: A | Discharge status: Home / Self Care | Payer: BC Managed Care – PPO | Source: Ambulatory Visit | Attending: Adult Health | Admitting: Adult Health

## 2022-12-21 ENCOUNTER — Encounter: Payer: Self-pay | Admitting: Adult Health

## 2022-12-21 ENCOUNTER — Ambulatory Visit: Payer: BC Managed Care – PPO | Admitting: Adult Health

## 2022-12-21 VITALS — BP 143/93 | HR 89 | Ht 63.0 in | Wt 170.5 lb

## 2022-12-21 DIAGNOSIS — Z3202 Encounter for pregnancy test, result negative: Secondary | ICD-10-CM | POA: Diagnosis not present

## 2022-12-21 DIAGNOSIS — Z113 Encounter for screening for infections with a predominantly sexual mode of transmission: Secondary | ICD-10-CM | POA: Diagnosis not present

## 2022-12-21 DIAGNOSIS — Z3009 Encounter for other general counseling and advice on contraception: Secondary | ICD-10-CM | POA: Diagnosis not present

## 2022-12-21 DIAGNOSIS — I10 Essential (primary) hypertension: Secondary | ICD-10-CM | POA: Diagnosis not present

## 2022-12-21 LAB — POCT URINE PREGNANCY: Preg Test, Ur: NEGATIVE

## 2022-12-21 MED ORDER — MISOPROSTOL 200 MCG PO TABS: ORAL_TABLET | ORAL | 0 refills | Status: DC

## 2022-12-21 NOTE — Progress Notes (Signed)
Subjective:     Patient ID: Hannah Sampson, female   DOB: 2001/04/23, 22 y.o.   MRN: [id number]  HPI Hannah Sampson is a 21 year old black female,single, G0P0, in to discuss birth control options and requests STD screening.  Last pap was negative HPV,NILM 03/25/22.  PCP is Gilmore Laroche NP   Review of Systems Last period was 10/18/22 and lasted  1 month She gained weight with depo Denies problems with intercourse, last sex was 1 1/2 weeks ago Reviewed past medical,surgical, social and family history. Reviewed medications and allergies.     Objective:   Physical Exam BP (!) 143/93 (BP Location: Right Arm, Patient Position: Sitting, Cuff Size: Normal)   Pulse 89   Ht 5\' 3"  (1.6 m)   Wt 170 lb 8 oz (77.3 kg)   LMP 10/18/2022 (Approximate) Comment: last period lasted 1 month  BMI 30.20 kg/m  UPT is negative  Skin warm and dry.Pelvic: external genitalia is normal in appearance no lesions, vagina: pink, scant blood,urethra has no lesions or masses noted, cervix:smooth and nulliparous, CV swab obtained, uterus: normal size, shape and contour, non tender, no masses felt, adnexa: no masses or tenderness noted. Bladder is non tender and no masses felt.    AA is 3 Fall risk is low    12/21/2022   11:34 AM 10/19/2022    9:22 AM 10/05/2022    8:06 AM  Depression screen PHQ 2/9  Decreased Interest 0 0 0  Down, Depressed, Hopeless 0 0 0  PHQ - 2 Score 0 0 0  Altered sleeping 0 0 0  Tired, decreased energy 0 0 0  Change in appetite 0 0 0  Feeling bad or failure about yourself  0 0 0  Trouble concentrating 0 0 0  Moving slowly or fidgety/restless 0 0 0  Suicidal thoughts 0 0 0  PHQ-9 Score 0 0 0  Difficult doing work/chores  Not difficult at all        12/21/2022   11:34 AM 10/19/2022    9:22 AM 10/05/2022    8:06 AM 06/24/2022    9:50 AM  GAD 7 : Generalized Anxiety Score  Nervous, Anxious, on Edge 0 0 0 0  Control/stop worrying 0 0 0 0  Worry too much - different things 0 0 0 0  Trouble  relaxing 0 0 0 0  Restless 0 0 0 0  Easily annoyed or irritable 0 0 0 0  Afraid - awful might happen 0 0 0 0  Total GAD 7 Score 0 0 0 0  Anxiety Difficulty  Not difficult at all Not difficult at all Not difficult at all      Upstream - 12/21/22 1142       Pregnancy Intention Screening   Does the patient want to become pregnant in the next year? No    Does the patient's partner want to become pregnant in the next year? No    Would the patient like to discuss contraceptive options today? Yes      Contraception Wrap Up   Current Method Female Condom    End Method Abstinence   to get IUD 12/27/22, will rx cytotec   Contraception Counseling Provided Yes    How was the end contraceptive method provided? N/A            Examination chaperoned by Malachy Mood LPN  Assessment:     1. Pregnancy examination or test, negative result - POCT urine pregnancy  2. General counseling and  advice for contraceptive management Discussed options, POP,depo,nexplanon and IUD due to BP She wants to try IUD, mirena. No sex, will rx Cytotec and get appt with Dr Charlotta Newton for IUD insertion I gave her the mirena handout  Meds ordered this encounter  Medications   misoprostol (CYTOTEC) 200 MCG tablet    Sig: Take 2 tablets by mouth 4 hours prior to appointment    Dispense:  2 tablet    Refill:  0    Order Specific Question:   Supervising Provider    Answer:   Despina Hidden, LUTHER H [[id number]]     3. Hypertension, essential Did not take Norvasc 10 mg  today Try to take daily  Follow up with PCP  4. Screening examination for STD (sexually transmitted disease) CV swab sent for GC/CHL,trich,BV and yeast  Will check HIV and RPR - Cervicovaginal ancillary only( [place]) - HIV Antibody (routine testing w rflx) - RPR     Plan:      No sex, take cytotec 2 tabs 4 hours prior to appointment for IUD insertion with Dr Charlotta Newton 12/27/22

## 2022-12-22 LAB — HIV ANTIBODY (ROUTINE TESTING W REFLEX): HIV Screen 4th Generation wRfx: NONREACTIVE

## 2022-12-22 LAB — RPR: RPR Ser Ql: NONREACTIVE

## 2022-12-23 ENCOUNTER — Other Ambulatory Visit: Payer: Self-pay | Admitting: Adult Health

## 2022-12-23 LAB — CERVICOVAGINAL ANCILLARY ONLY
Bacterial Vaginitis (gardnerella): POSITIVE — AB
Candida Glabrata: NEGATIVE
Candida Vaginitis: NEGATIVE
Chlamydia: NEGATIVE
Comment: NEGATIVE
Comment: NEGATIVE
Comment: NEGATIVE
Comment: NEGATIVE
Comment: NEGATIVE
Comment: NORMAL
Neisseria Gonorrhea: NEGATIVE
Trichomonas: NEGATIVE

## 2022-12-23 MED ORDER — METRONIDAZOLE 500 MG PO TABS: 500.0000 mg | ORAL_TABLET | Freq: Two times a day (BID) | ORAL | 0 refills | Status: DC

## 2022-12-23 NOTE — Progress Notes (Signed)
+  BV on vaginal swab, will rx flagyl, no sex or alcohol while taking  

## 2022-12-27 ENCOUNTER — Ambulatory Visit: Payer: BC Managed Care – PPO | Admitting: Obstetrics & Gynecology

## 2022-12-27 ENCOUNTER — Encounter: Payer: Self-pay | Admitting: Obstetrics & Gynecology

## 2022-12-27 VITALS — BP 146/89 | HR 108 | Ht 63.0 in | Wt 167.8 lb

## 2022-12-27 DIAGNOSIS — Z3202 Encounter for pregnancy test, result negative: Secondary | ICD-10-CM | POA: Diagnosis not present

## 2022-12-27 DIAGNOSIS — Z30430 Encounter for insertion of intrauterine contraceptive device: Secondary | ICD-10-CM

## 2022-12-27 DIAGNOSIS — Z3009 Encounter for other general counseling and advice on contraception: Secondary | ICD-10-CM

## 2022-12-27 LAB — POCT URINE PREGNANCY: Preg Test, Ur: NEGATIVE

## 2022-12-27 MED ORDER — LEVONORGESTREL 19.5 MG IU IUD
INTRAUTERINE_SYSTEM | Freq: Once | INTRAUTERINE | Status: AC
  Administered 2022-12-27: 1 via INTRAUTERINE

## 2022-12-27 MED ORDER — LEVONORGESTREL 20 MCG/DAY IU IUD: 1.0000 | INTRAUTERINE_SYSTEM | Freq: Once | INTRAUTERINE | Status: DC

## 2022-12-27 NOTE — Progress Notes (Signed)
   GYN VISIT Patient name: Hannah Sampson MRN [id number]  Date of birth: [birth year] Chief Complaint:   Contraception (IUD Insertion)  History of Present Illness:   Hannah Sampson is a 22 y.o. G0P0000 female being seen today for contraceptive management.  At her last visit with Roseanne Reno all contraceptive options were reviewed.  She is currently on the Depo shot; however, she wishes to transition to a long-term option.  She currently has a period every other month.  Denies heavy bleeding or dysmenorrhea.  Denies pelvic pain, irregular vaginal discharge.  No acute complaints or concerns since her prior visit.     Options were reviewed- pt desires [name]  Patient's last menstrual period was 10/18/2022 (approximate).    Review of Systems:   Pertinent items are noted in HPI Denies fever/chills, dizziness, headaches, visual disturbances, fatigue, shortness of breath, chest pain, abdominal pain, vomiting, no problems with periods, bowel movements, urination, or intercourse unless otherwise stated above.  Pertinent History Reviewed:  No past surgical history on file.  Past Medical History:  Diagnosis Date   Anemia    COVID 10/22/2020   Reviewed problem list, medications and allergies. Physical Assessment:   Vitals:   12/27/22 1349  BP: (!) 146/89  Pulse: (!) 108  Weight: 167 lb 12.8 oz (76.1 kg)  Height: 5\' 3"  (1.6 m)  Body mass index is 29.72 kg/m.       Physical Examination:   General appearance: alert, well appearing, and in no distress  Psych: mood appropriate, normal affect  Skin: warm & dry   Cardiovascular: normal heart rate noted  Respiratory: normal respiratory effort, no distress  Abdomen: soft, non-tender   Pelvic: VULVA: normal appearing vulva with no masses, tenderness or lesions, VAGINA: normal appearing vagina with normal color and discharge, no lesions, CERVIX: normal appearing cervix without discharge or lesions  Extremities: no edema   Chaperone: Faith Rogue    IUD INSERTION   The risks and benefits of the method and placement have been thouroughly reviewed with the patient and all questions were answered.  Specifically the patient is aware of failure rate of 07/998, expulsion of the IUD and of possible perforation.  The patient is aware of irregular bleeding due to the method and understands the incidence of irregular bleeding diminishes with time.  Signed copy of informed consent in chart.    A sterile speculum was placed in the vagina.  The cervix was visualized, prepped using Betadine, and grasped with a single tooth tenaculum. The uterus was sounded to 7 cm.  Kyleena  IUD placed per manufacturer's recommendations. The strings were trimmed to approximately 3 cm. The patient tolerated the procedure well.    Chaperone: Faith Rogue    Assessment & Plan:   [name] IUD insertion The patient was given post procedure instructions, including signs and symptoms of infection and to check for the strings after each menses or each month, and refraining from intercourse or anything in the vagina for 3 days. She was given a care card with date IUD placed, and date IUD to be removed. She is scheduled for a f/u appointment in  6- 8 weeks.    Orders Placed This Encounter  Procedures   POCT urine pregnancy    Return for 6-8wk IUD check.   Myna Hidalgo, DO Attending Obstetrician & Gynecologist, [facility] for [employer], [facility]

## 2022-12-30 IMAGING — US US THYROID
1 series · 13 of 25 positions shown · non-contrast
Comparison: None.

CLINICAL DATA: Palpable abnormality.

EXAM:
THYROID ULTRASOUND
TECHNIQUE: Ultrasound examination of the thyroid gland and adjacent soft
tissues was performed.

[Series 1: us thyroid · 13 of 62 slices shown]
[im 1/62]
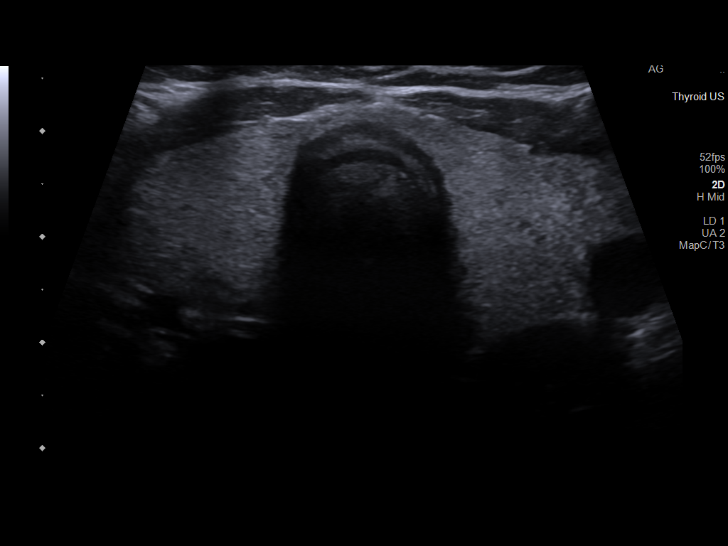
[im 6/62]
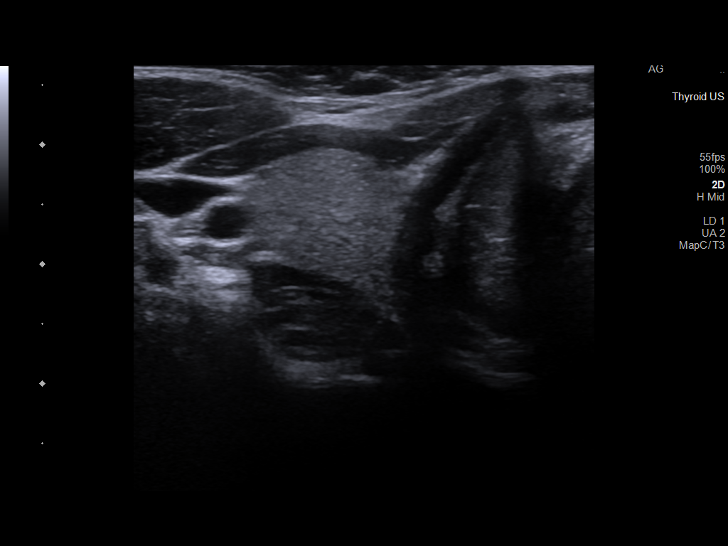
[im 11/62]
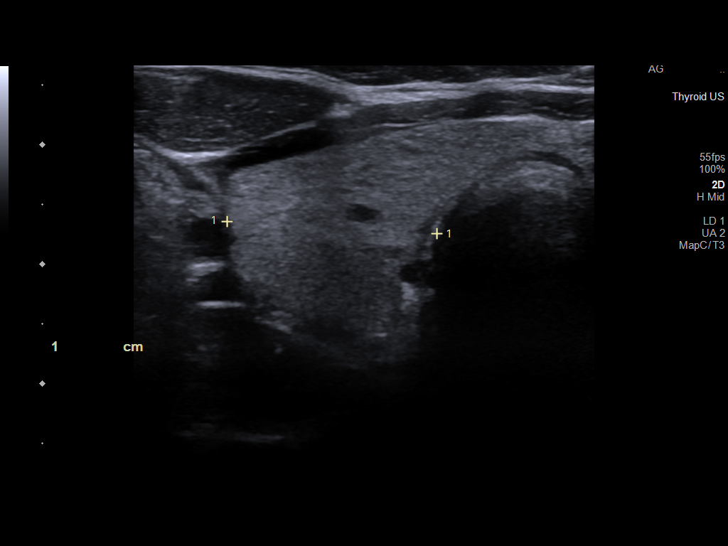
[im 16/62]
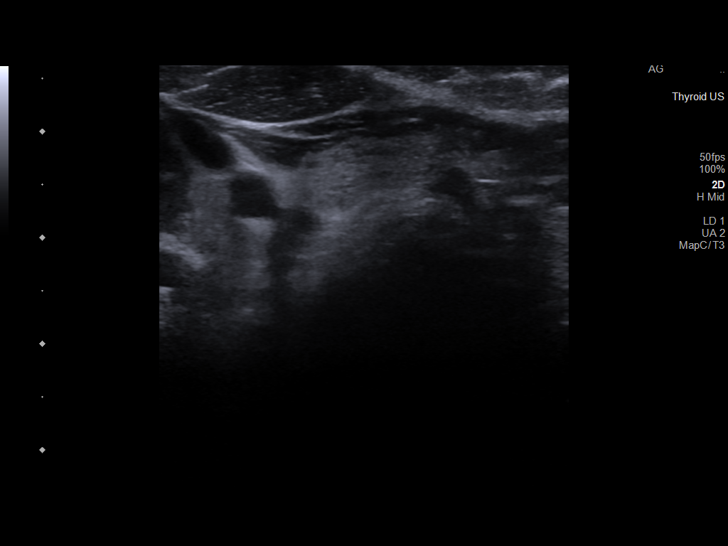
[im 21/62]
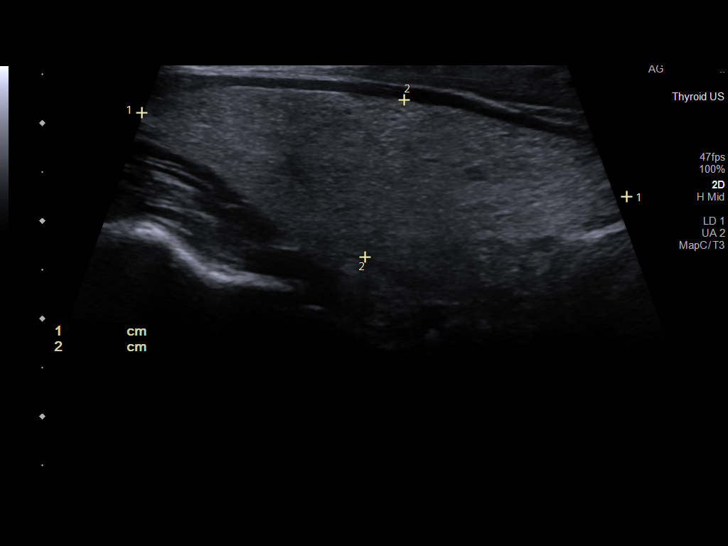
[im 26/62]
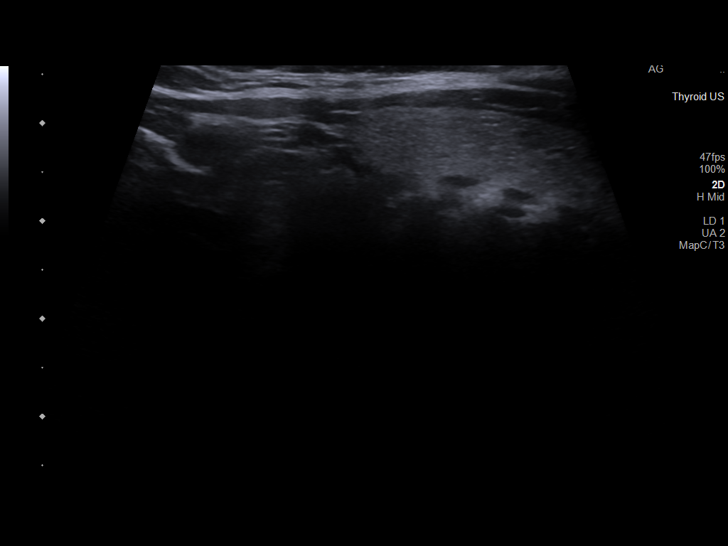
[im 31/62]
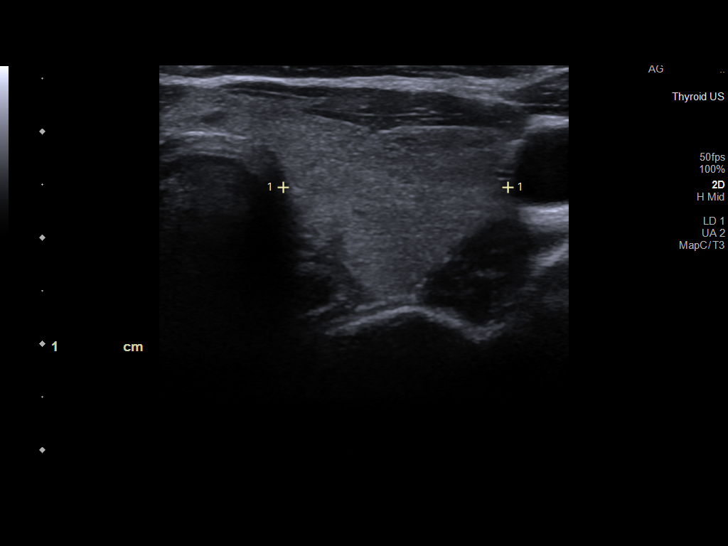
[im 36/62]
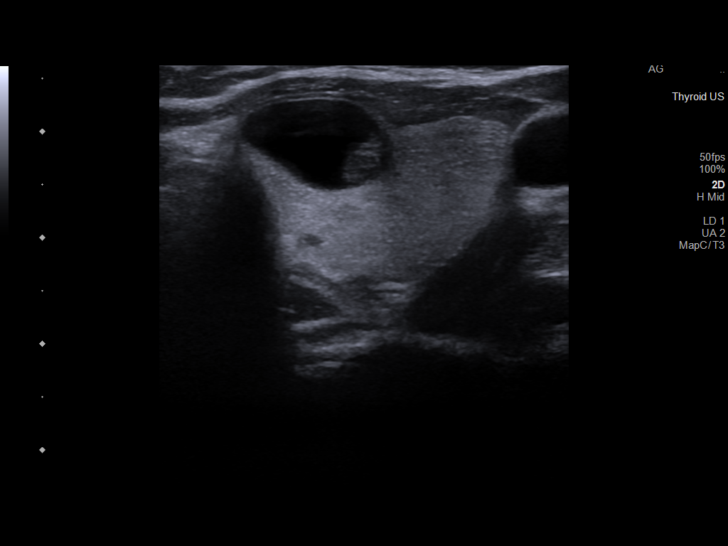
[im 41/62]
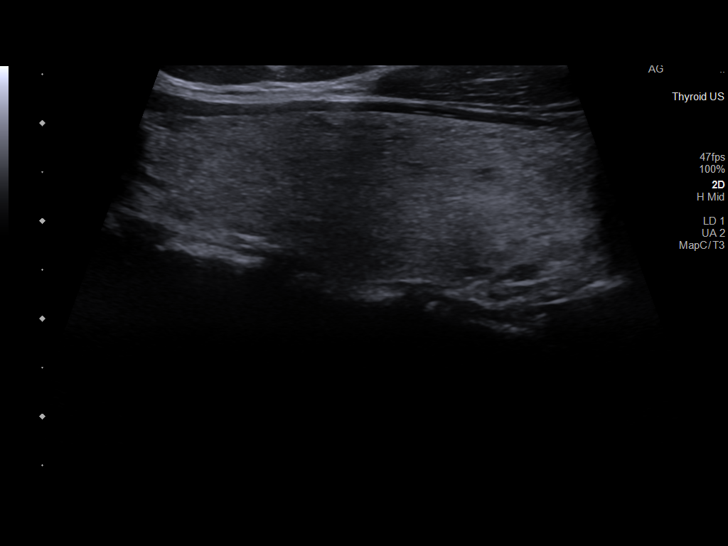
[im 46/62]
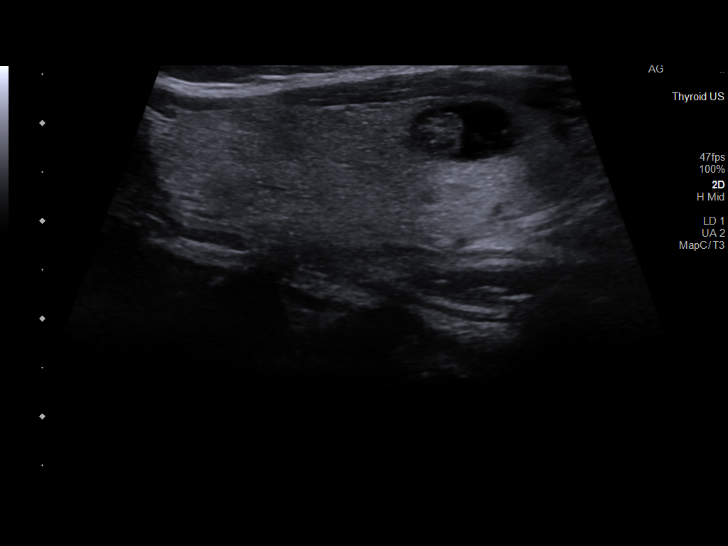
[im 51/62]
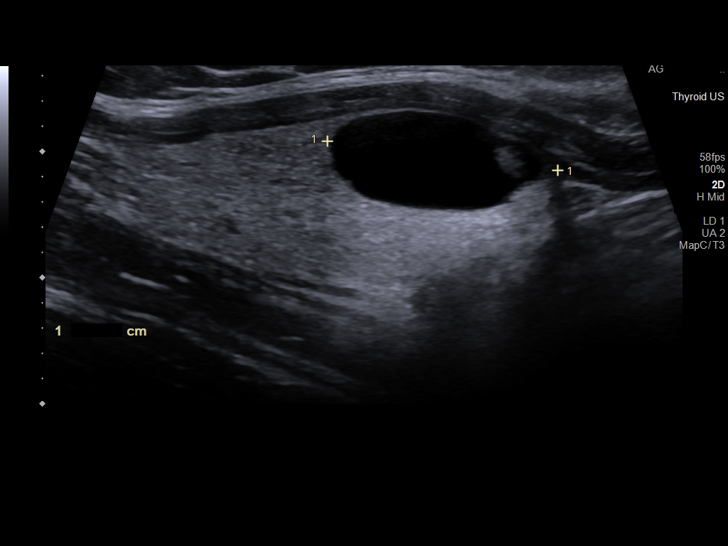
[im 56/62]
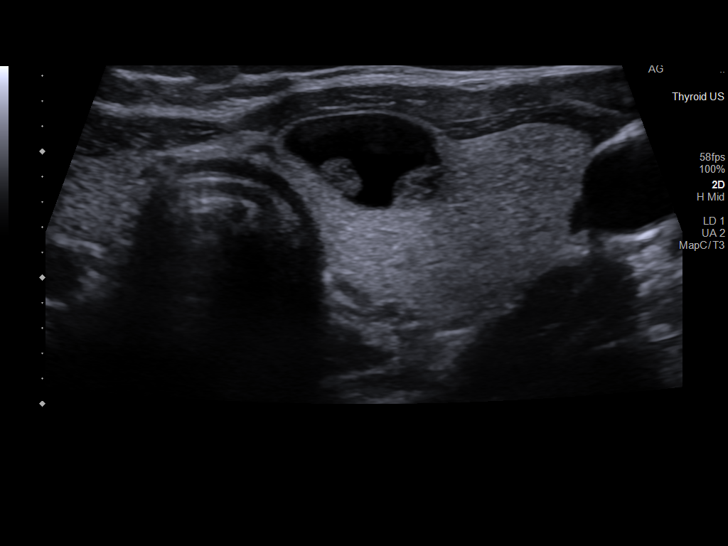
[im 62/62]
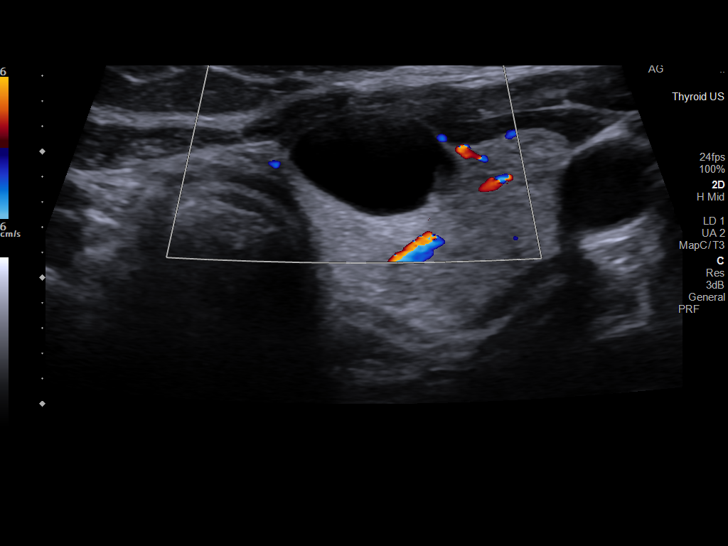

[13 of 25 positions shown; findings below may reference images not displayed]

FINDINGS: Parenchymal Echotexture: Mildly heterogenous

Isthmus: 0.3 cm

Right lobe: 5.0 x 1.7 x 1.8 cm

Left lobe: 4.8 x 1.8 x 2.1 cm

_________________________________________________________

Estimated total number of nodules >/= 1 cm: 1

Number of spongiform nodules >/=  2 cm not described below (TR1): 0

Number of mixed cystic and solid nodules >/= 1.5 cm not described
below (TR2): 0

_________________________________________________________

Nodule # 1:

Location: Left; Superior

Maximum size: 1.8 cm; Other 2 dimensions: 1.5 x 0.9 cm

Composition: mixed cystic and solid (1)

Echogenicity: anechoic (0)

Shape: not taller-than-wide (0)

Margins: smooth (0)

Echogenic foci: none (0)

ACR TI-RADS total points: 1.

ACR TI-RADS risk category: TR1 (0-1 points).

ACR TI-RADS recommendations:

This nodule does NOT meet TI-RADS criteria for biopsy or dedicated
follow-up.

_________________________________________________________
IMPRESSION: 1. Mildly exophytic, mostly cystic nodule (labeled 1, 1.8 cm,
TI-RADS category 1) about the anterior surface of the left superior
thyroid. If this finding correlates with palpable
abnormality/symptoms no further ultrasound evaluation or tissue
sampling is required, however percutaneous image guided aspiration
by Interventional Radiology is available upon request as desired.
2. The remaining visualized thyroid is within normal limits.

The above is in keeping with the ACR TI-RADS recommendations - [HOSPITAL] 4826;[DATE].

## 2023-01-07 ENCOUNTER — Telehealth: Payer: BC Managed Care – PPO

## 2023-01-09 ENCOUNTER — Telehealth: Payer: BC Managed Care – PPO | Admitting: Family

## 2023-01-09 DIAGNOSIS — N939 Abnormal uterine and vaginal bleeding, unspecified: Secondary | ICD-10-CM

## 2023-01-09 DIAGNOSIS — Z975 Presence of (intrauterine) contraceptive device: Secondary | ICD-10-CM

## 2023-01-09 NOTE — Progress Notes (Signed)
Virtual Visit Consent   Hannah Sampson, you are scheduled for a virtual visit with a [facility] provider today. Just as with appointments in the office, your consent must be obtained to participate. Your consent will be active for this visit and any virtual visit you may have with one of our providers in the next 365 days. If you have a MyChart account, a copy of this consent can be sent to you electronically.  As this is a virtual visit, video technology does not allow for your provider to perform a traditional examination. This may limit your provider's ability to fully assess your condition. If your provider identifies any concerns that need to be evaluated in person or the need to arrange testing (such as labs, EKG, etc.), we will make arrangements to do so. Although advances in technology are sophisticated, we cannot ensure that it will always work on either your end or our end. If the connection with a video visit is poor, the visit may have to be switched to a telephone visit. With either a video or telephone visit, we are not always able to ensure that we have a secure connection.  By engaging in this virtual visit, you consent to the provision of healthcare and authorize for your insurance to be billed (if applicable) for the services provided during this visit. Depending on your insurance coverage, you may receive a charge related to this service.  I need to obtain your verbal consent now. Are you willing to proceed with your visit today? Hannah Sampson has provided verbal consent on 01/09/2023 for a virtual visit (video or telephone). Hannah Rodney, FNP  Date: 01/09/2023 3:13 PM  Virtual Visit via Video Note   I, Hannah Sampson, connected with  Hannah Sampson  ([id number], May 24, 2001) on 01/09/23 at  3:15 PM EDT by a video-enabled telemedicine application and verified that I am speaking with the correct person using two identifiers.  Location: Patient: Virtual Visit Location Patient:  Other: car Provider: Virtual Visit Location Provider: Home Office   I discussed the limitations of evaluation and management by telemedicine and the availability of in person appointments. The patient expressed understanding and agreed to proceed.    History of Present Illness: Hannah Sampson is a 22 y.o. who identifies as a female who was assigned female at birth, and is being seen today for menstrual cycle for the last 2 weeks. She had an IUD placed on 12/27/22 and has been bleeding since. Denies any heavy bleeding and cramping.   HPI: HPI  Problems:  Patient Active Problem List   Diagnosis Date Noted   Screening examination for STD (sexually transmitted disease) 12/21/2022   General counseling and advice for contraceptive management 12/21/2022   Pregnancy examination or test, negative result 12/21/2022   Abnormal uterine bleeding (AUB) 10/05/2022   Encounter for annual general medical examination with abnormal findings in adult 06/24/2022   Cervical cancer screening 03/25/2022   Encounter for general adult medical examination with abnormal findings 12/16/2020   Enlarged thyroid gland 12/16/2020   Hypertension, essential 10/22/2020   Heart murmur 11/20/2013    Allergies: No Known Allergies Medications:  Current Outpatient Medications:    amLODipine (NORVASC) 10 MG tablet, TAKE 1 TABLET(10 MG) BY MOUTH DAILY, Disp: 90 tablet, Rfl: 1   misoprostol (CYTOTEC) 200 MCG tablet, Take 2 tablets by mouth 4 hours prior to appointment, Disp: 2 tablet, Rfl: 0   naproxen sodium (ALEVE) 220 MG tablet, Take 440 mg by mouth once as needed (for pain).,  Disp: , Rfl:   Observations/Objective: Patient is well-developed, well-nourished in no acute distress.  Resting comfortably   Head is normocephalic, atraumatic.  No labored breathing.  Speech is clear and coherent with logical content.  Patient is alert and oriented at baseline.    Assessment and Plan: 1. Abnormal uterine bleeding (AUB)  2.  IUD (intrauterine device) in place  Discussed abnormal bleeding is normal for the next few months until hormones balanced. She will call her GYN this week and let them know Had normal pelvic US on 10/13/22 Follow up if symptoms worsen or do not improve    Follow Up Instructions: I discussed the assessment and treatment plan with the patient. The patient was provided an opportunity to ask questions and all were answered. The patient agreed with the plan and demonstrated an understanding of the instructions.  A copy of instructions were sent to the patient via MyChart unless otherwise noted below.     The patient was advised to call back or seek an in-person evaluation if the symptoms worsen or if the condition fails to improve as anticipated.  Time:  I spent 6 minutes with the patient via telehealth technology discussing the above problems/concerns.    Hannah Rodney, FNP

## 2023-01-09 NOTE — Patient Instructions (Signed)
Abnormal Uterine Bleeding  Abnormal uterine bleeding is unusual bleeding from the uterus. It includes bleeding after sex, or bleeding or spotting between menstrual periods. It may also include bleeding that is heavier than normal, menstrual periods that last longer than usual, or bleeding that occurs after menopause. Abnormal uterine bleeding can affect teenagers, women in their reproductive years, pregnant women, and women who have reached menopause. Common causes of abnormal uterine bleeding include: Pregnancy. Abnormal growths within the lining of the uterus (polyps). Benign tumors or growths in the uterus (fibroids). These are not cancer. Infection. Cancer. Too much or too little of some hormones in the body (hormonal imbalances). Any type of abnormal bleeding should be checked by a health care provider. Many cases are minor and simple to treat, but others may be more serious. Treatment will depend on the cause of the bleeding and how severe it is. Follow these instructions at home: Medicines Take over-the-counter and prescription medicines only as told by your health care provider. Ask your health care provider about: Taking medicines such as aspirin and ibuprofen. These medicines can thin your blood. Do not take these medicines unless your health care provider tells you to take them. Taking over-the-counter medicines, vitamins, herbs, and supplements. If you were prescribed iron pills, take them as told by your health care provider. Iron pills help to replace iron that your body loses because of this condition. Managing constipation In cases of severe bleeding, you may be asked to increase your iron intake to treat anemia. Doing this may cause constipation. To prevent or treat constipation, you may need to: Drink enough fluid to keep your urine pale yellow. Take over-the-counter or prescription medicines. Eat foods that are high in fiber, such as beans, whole grains, and fresh fruits and  vegetables. Limit foods that are high in fat and processed sugars, such as fried or sweet foods. Activity Alter your activity to decrease bleeding if you need to change your sanitary pad more than one time every 2 hours: Lie in bed with your feet raised (elevated). Place a cold pack on your lower abdomen. Rest as much as possible until the bleeding stops or slows down. General instructions Do not use tampons, douche, or have sex until your health care provider says these things are okay. Change your sanitary pads often. Get regular exams. These include pelvic exams and cervical cancer screenings. It is up to you to get the results of any tests that are done. Ask your health care provider, or the department that is doing the tests, when your results will be ready. Monitor your condition for any changes. For 2 months, write down: When your menstrual period starts. When your menstrual period ends. When any abnormal vaginal bleeding occurs. What problems you notice. Keep all follow-up visits. This is important. Contact a health care provider if: You have bleeding that lasts for more than one week. You feel dizzy at times. You feel nauseous or you vomit. You feel light-headed or weak. You notice any other changes that show that your condition is getting worse. Get help right away if: You faint. You have bleeding that soaks through a sanitary pad every hour. You have pain in the abdomen. You have a fever or chills. You become sweaty or weak. You pass large blood clots from your vagina. These symptoms may represent a serious problem that is an emergency. Do not wait to see if the symptoms will go away. Get medical help right away. Call your local emergency services (  911 in the [place] Do not drive yourself to the hospital. Summary Abnormal uterine bleeding is unusual bleeding from the uterus. Any type of abnormal bleeding should be checked by a health care provider. Many cases are minor and  simple to treat, but others may be more serious. Treatment will depend on the cause of the bleeding and how severe it is. Get help right away if you faint, you have bleeding that soaks through a sanitary pad every hour, or you pass large blood clots from your vagina. This information is not intended to replace advice given to you by your health care provider. Make sure you discuss any questions you have with your health care provider. Document Revised: 11/04/2020 Document Reviewed: 11/04/2020 Elsevier Patient Education  2024 [employer].

## 2023-01-12 ENCOUNTER — Encounter: Payer: Self-pay | Admitting: Obstetrics & Gynecology

## 2023-01-12 ENCOUNTER — Other Ambulatory Visit: Payer: Self-pay | Admitting: Adult Health

## 2023-01-12 MED ORDER — MEGESTROL ACETATE 40 MG PO TABS: ORAL_TABLET | ORAL | 1 refills | Status: DC

## 2023-01-12 NOTE — Progress Notes (Signed)
Rx megace for bleeding with IUD 

## 2023-01-24 ENCOUNTER — Ambulatory Visit: Payer: BC Managed Care – PPO | Admitting: Family Medicine

## 2023-02-09 ENCOUNTER — Ambulatory Visit: Payer: BC Managed Care – PPO | Admitting: Adult Health

## 2023-02-09 ENCOUNTER — Encounter: Payer: Self-pay | Admitting: Adult Health

## 2023-02-09 VITALS — BP 120/79 | HR 81 | Ht 63.0 in | Wt 174.0 lb

## 2023-02-09 DIAGNOSIS — Z30431 Encounter for routine checking of intrauterine contraceptive device: Secondary | ICD-10-CM

## 2023-02-09 DIAGNOSIS — Z975 Presence of (intrauterine) contraceptive device: Secondary | ICD-10-CM | POA: Insufficient documentation

## 2023-02-09 NOTE — Progress Notes (Signed)
  Subjective:     Patient ID: Hannah Sampson, female   DOB: Jan 06, 2001, 22 y.o.   MRN: [id number]  HPI Earl is a 22 year old black female,single, G0P0, back in for IUD check, she had Kyleena inserted 12/27/22 by Dr Charlotta Newton. She has not felt strings     Component Value Date/Time   DIAGPAP  03/25/2022 1001    - Negative for intraepithelial lesion or malignancy (NILM)   HPVHIGH Negative 03/25/2022 1001   ADEQPAP  03/25/2022 1001    Satisfactory for evaluation; transformation zone component PRESENT.    PCP is Gilmore Laroche NP   Review of Systems For IUD check Denies any pain, is on period Had sex without any discomfort Reviewed past medical,surgical, social and family history. Reviewed medications and allergies.     Objective:   Physical Exam BP 120/79 (BP Location: Left Arm, Patient Position: Sitting, Cuff Size: Normal)   Pulse 81   Ht 5\' 3"  (1.6 m)   Wt 174 lb (78.9 kg)   LMP 02/02/2023 (Approximate)   BMI 30.82 kg/m     Skin warm and dry.Pelvic: external genitalia is normal in appearance no lesions, vagina: +blood,urethra has no lesions or masses noted, cervix:smooth,+IUD strings at os, uterus: normal size, shape and contour, non tender, no masses felt, adnexa: no masses or tenderness noted. Bladder is non tender and no masses felt.  Upstream - 02/09/23 0858       Pregnancy Intention Screening   Does the patient want to become pregnant in the next year? No    Does the patient's partner want to become pregnant in the next year? No    Would the patient like to discuss contraceptive options today? No      Contraception Wrap Up   Current Method IUD or IUS    End Method IUD or IUS    Contraception Counseling Provided No            Examination chaperoned by Malachy Mood LPN  Assessment:     1. IUD check up +strings at os   2. IUD (intrauterine device) in place Kyleena placed 12/27/22    Plan:     Follow up prn

## 2023-02-16 ENCOUNTER — Encounter: Payer: Self-pay | Admitting: Family Medicine

## 2023-02-16 ENCOUNTER — Ambulatory Visit (INDEPENDENT_AMBULATORY_CARE_PROVIDER_SITE_OTHER): Payer: BC Managed Care – PPO | Admitting: Family Medicine

## 2023-02-16 VITALS — BP 129/77 | HR 88 | Ht 63.0 in | Wt 169.0 lb

## 2023-02-16 DIAGNOSIS — E039 Hypothyroidism, unspecified: Secondary | ICD-10-CM | POA: Diagnosis not present

## 2023-02-16 DIAGNOSIS — I10 Essential (primary) hypertension: Secondary | ICD-10-CM | POA: Diagnosis not present

## 2023-02-16 DIAGNOSIS — E559 Vitamin D deficiency, unspecified: Secondary | ICD-10-CM | POA: Diagnosis not present

## 2023-02-16 DIAGNOSIS — E7849 Other hyperlipidemia: Secondary | ICD-10-CM

## 2023-02-16 DIAGNOSIS — R7301 Impaired fasting glucose: Secondary | ICD-10-CM

## 2023-02-16 NOTE — Assessment & Plan Note (Signed)
Controlled  She takes amlodipine 10 mg daily Asymptomatic in the clinic Low-sodium diet with increased physical activity encouraged BP Readings from Last 3 Encounters:  02/16/23 129/77  02/09/23 120/79  12/27/22 (!) 146/89

## 2023-02-16 NOTE — Patient Instructions (Addendum)
I appreciate the opportunity to provide care to you today!    Follow up:  5 months,early December  Labs: please stop by the lab today to get your blood drawn (CBC, CMP, TSH, Lipid profile, HgA1c, Vit D)   Please continue to a heart-healthy diet and increase your physical activities. Try to exercise for at least five days a week.    It was a pleasure to see you and I look forward to continuing to work together on your health and well-being. Please do not hesitate to call the office if you need care or have questions about your care.  In case of emergency, please visit the Emergency Department for urgent care, or contact our clinic at [phone] to schedule an appointment. We're here to help you!   Have a wonderful day and week. With Gratitude, Gilmore Laroche MSN, FNP-BC

## 2023-02-16 NOTE — Progress Notes (Signed)
Established Patient Office Visit  Subjective:  Patient ID: Hannah Sampson, female    DOB: 2000-09-26  Age: 22 y.o. MRN: [id number]  CC:  Chief Complaint  Patient presents with   Chronic Care Management    3 month f/u    HPI Hannah Sampson is a 22 y.o. female with past medical history of  presents for f/u of essential hypertension chronic medical conditions. For the details of today's visit, please refer to the assessment and plan.     Past Medical History:  Diagnosis Date   Anemia    COVID 10/22/2020    History reviewed. No pertinent surgical history.  Family History  Problem Relation Age of Onset   Hypertension Father    Diabetes Paternal Grandfather    Diabetes Maternal Grandmother     Social History   Socioeconomic History   Marital status: Single    Spouse name: Not on file   Number of children: Not on file   Years of education: Not on file   Highest education level: Associate degree: occupational, Scientist, product/process development, or vocational program  Occupational History   Not on file  Tobacco Use   Smoking status: Never   Smokeless tobacco: Never  Vaping Use   Vaping status: Never Used  Substance and Sexual Activity   Alcohol use: Yes   Drug use: No   Sexual activity: Yes    Birth control/protection: Condom, I.U.D.  Other Topics Concern   Not on file  Social History Narrative   Lives with mom, dad and brother   No pets       Enjoys reading-fiction favorite Artist, enjoys working to get outside house      Diet: meat, veggies, fruits (red meats, not big sweets, chips yes)   Caffeine: soda week   Water: 4 bottles daily      Wear seat belt    Wear sunscreen   Smoke detectors at home     Does not use phone while driving    Social Determinants of Health   Financial Resource Strain: Low Risk  (12/21/2022)   Overall Financial Resource Strain (CARDIA)    Difficulty of Paying Living Expenses: Not hard at all  Food Insecurity: No Food Insecurity  (12/21/2022)   Hunger Vital Sign    Worried About Running Out of Food in the Last Year: Never true    Ran Out of Food in the Last Year: Never true  Transportation Needs: No Transportation Needs (12/21/2022)   PRAPARE - Administrator, Civil Service (Medical): No    Lack of Transportation (Non-Medical): No  Physical Activity: Insufficiently Active (12/21/2022)   Exercise Vital Sign    Days of Exercise per Week: 3 days    Minutes of Exercise per Session: 20 min  Stress: No Stress Concern Present (12/21/2022)   [employer] of Occupational Health - Occupational Stress Questionnaire    Feeling of Stress : Not at all  Social Connections: Moderately Isolated (12/21/2022)   Social Connection and Isolation Panel [NHANES]    Frequency of Communication with Friends and Family: More than three times a week    Frequency of Social Gatherings with Friends and Family: Three times a week    Attends Religious Services: 1 to 4 times per year    Active Member of Clubs or Organizations: No    Attends Banker Meetings: Never    Marital Status: Never married  Intimate Partner Violence: Not At Risk (12/21/2022)   Humiliation, Afraid,  Rape, and Kick questionnaire    Fear of Current or Ex-Partner: No    Emotionally Abused: No    Physically Abused: No    Sexually Abused: No    Outpatient Medications Prior to Visit  Medication Sig Dispense Refill   amLODipine (NORVASC) 10 MG tablet TAKE 1 TABLET(10 MG) BY MOUTH DAILY 90 tablet 1   levonorgestrel (KYLEENA) 19.5 MG IUD 1 each by Intrauterine route once.     megestrol (MEGACE) 40 MG tablet Take 3 x 5 days then 2 daily for 5 days then 1 daily til no bleeding 45 tablet 1   naproxen sodium (ALEVE) 220 MG tablet Take 440 mg by mouth once as needed (for pain).     No facility-administered medications prior to visit.    No Known Allergies  ROS Review of Systems  Constitutional:  Negative for chills and fever.  Eyes:  Negative for visual  disturbance.  Respiratory:  Negative for chest tightness and shortness of breath.   Neurological:  Negative for dizziness and headaches.      Objective:    Physical Exam HENT:     Head: Normocephalic.     Mouth/Throat:     Mouth: Mucous membranes are moist.  Cardiovascular:     Rate and Rhythm: Normal rate.     Heart sounds: Normal heart sounds.  Pulmonary:     Effort: Pulmonary effort is normal.     Breath sounds: Normal breath sounds.  Neurological:     Mental Status: She is alert.     BP 129/77   Pulse 88   Ht 5\' 3"  (1.6 m)   Wt 169 lb 0.6 oz (76.7 kg)   LMP 02/02/2023 (Approximate)   SpO2 98%   BMI 29.94 kg/m  Wt Readings from Last 3 Encounters:  02/16/23 169 lb 0.6 oz (76.7 kg)  02/09/23 174 lb (78.9 kg)  12/27/22 167 lb 12.8 oz (76.1 kg)    Lab Results  Component Value Date   TSH 1.240 06/23/2022   Lab Results  Component Value Date   WBC 3.4 10/05/2022   HGB 13.5 10/05/2022   HCT 40.4 10/05/2022   MCV 90 10/05/2022   PLT 255 10/05/2022   Lab Results  Component Value Date   NA 139 06/23/2022   K 4.0 06/23/2022   CO2 22 06/23/2022   GLUCOSE 94 06/23/2022   BUN 12 06/23/2022   CREATININE 0.85 06/23/2022   BILITOT 0.3 06/23/2022   ALKPHOS 78 06/23/2022   AST 11 06/23/2022   ALT 11 06/23/2022   PROT 6.7 06/23/2022   ALBUMIN 4.3 06/23/2022   CALCIUM 9.2 06/23/2022   EGFR 99 06/23/2022   Lab Results  Component Value Date   CHOL 170 06/23/2022   Lab Results  Component Value Date   HDL 50 06/23/2022   Lab Results  Component Value Date   LDLCALC 110 (H) 06/23/2022   Lab Results  Component Value Date   TRIG 48 06/23/2022   Lab Results  Component Value Date   CHOLHDL 3.4 06/23/2022   Lab Results  Component Value Date   HGBA1C 5.7 (H) 06/23/2022      Assessment & Plan:  Hypertension, essential Assessment & Plan: Controlled  She takes amlodipine 10 mg daily Asymptomatic in the clinic Low-sodium diet with increased physical  activity encouraged BP Readings from Last 3 Encounters:  02/16/23 129/77  02/09/23 120/79  12/27/22 (!) 146/89       IFG (impaired fasting glucose) -  Hemoglobin A1c  Vitamin D deficiency -     VITAMIN D 25 Hydroxy (Vit-D Deficiency, Fractures)  Hypothyroidism (acquired) -     TSH + free T4  Other hyperlipidemia -     Lipid panel -     CMP14+EGFR -     CBC with Differential/Platelet  Note: This chart has been completed using Engineer, civil (consulting) software, and while attempts have been made to ensure accuracy, certain words and phrases may not be transcribed as intended.    Follow-up: Return in about 5 months (around 07/19/2023) for CPE.   Gilmore Laroche, FNP

## 2023-03-17 ENCOUNTER — Ambulatory Visit
Admission: RE | Admit: 2023-03-17 | Discharge: 2023-03-17 | Disposition: A | Discharge status: Home / Self Care | Payer: BC Managed Care – PPO | Source: Ambulatory Visit | Attending: Family Medicine | Admitting: Family Medicine

## 2023-03-17 VITALS — BP 129/85 | HR 88 | Temp 98.4°F | Resp 13

## 2023-03-17 DIAGNOSIS — S46912A Strain of unspecified muscle, fascia and tendon at shoulder and upper arm level, left arm, initial encounter: Secondary | ICD-10-CM

## 2023-03-17 DIAGNOSIS — A09 Infectious gastroenteritis and colitis, unspecified: Secondary | ICD-10-CM | POA: Diagnosis not present

## 2023-03-17 MED ORDER — CYCLOBENZAPRINE HCL 10 MG PO TABS: 10.0000 mg | ORAL_TABLET | Freq: Three times a day (TID) | ORAL | 0 refills | Status: DC | PRN

## 2023-03-17 MED ORDER — CIPROFLOXACIN HCL 500 MG PO TABS: 500.0000 mg | ORAL_TABLET | Freq: Two times a day (BID) | ORAL | 0 refills | Status: DC

## 2023-03-17 NOTE — ED Triage Notes (Signed)
Pt c/o diarrhea after a trip to [place]. Stated Tuesday night is when Diarrhea started and has bee consistent, she returned home yesterday evening.

## 2023-03-21 NOTE — ED Provider Notes (Signed)
[facility]    CSN: [id number] Arrival date & time: 03/17/23  0905      History   Chief Complaint Chief Complaint  Patient presents with   Diarrhea    Just getting back from [place], have had diarrhea since yesterday and I've also injured my shoulder - Entered by patient    HPI Hannah Sampson is a 22 y.o. female.   Patient presenting today with new onset profuse diarrhea that started during a trip to [place].  She states symptoms have been persistent for 3 days now, denies fevers, melena, nausea, vomiting, severe abdominal pain though is having some cramping.  So far not sure anything over-the-counter for symptoms.  She also thinks she may have strained her shoulder playing volleyball on vacation.  Left shoulder pain anteriorly, worse with movement.  Denies radiation of pain, numbness, tingling, weakness of the arm.  Not tried anything for the symptoms.    Past Medical History:  Diagnosis Date   Anemia    COVID 10/22/2020    Patient Active Problem List   Diagnosis Date Noted   IUD (intrauterine device) in place 02/09/2023   IUD check up 02/09/2023   Screening examination for STD (sexually transmitted disease) 12/21/2022   General counseling and advice for contraceptive management 12/21/2022   Pregnancy examination or test, negative result 12/21/2022   Abnormal uterine bleeding (AUB) 10/05/2022   Encounter for annual general medical examination with abnormal findings in adult 06/24/2022   Cervical cancer screening 03/25/2022   Encounter for general adult medical examination with abnormal findings 12/16/2020   Enlarged thyroid gland 12/16/2020   Hypertension, essential 10/22/2020   Heart murmur 11/20/2013    History reviewed. No pertinent surgical history.  OB History     Gravida  0   Para  0   Term  0   Preterm  0   AB  0   Living  0      SAB  0   IAB  0   Ectopic  0   Multiple  0   Live Births  0            Home Medications     Prior to Admission medications   Medication Sig Start Date End Date Taking? Authorizing Provider  ciprofloxacin (CIPRO) 500 MG tablet Take 1 tablet (500 mg total) by mouth 2 (two) times daily. 03/17/23  Yes Particia Nearing, PA-C  cyclobenzaprine (FLEXERIL) 10 MG tablet Take 1 tablet (10 mg total) by mouth 3 (three) times daily as needed for muscle spasms. Do not drink alcohol or drive while taking this medication.  May cause drowsiness. 03/17/23  Yes Particia Nearing, PA-C  amLODipine (NORVASC) 10 MG tablet TAKE 1 TABLET(10 MG) BY MOUTH DAILY 09/30/22   Gilmore Laroche, FNP  levonorgestrel [facility]) 19.5 MG IUD 1 each by Intrauterine route once.    [provider]    Family History Family History  Problem Relation Age of Onset   Hypertension Father    Diabetes Paternal Grandfather    Diabetes Maternal Grandmother     Social History Social History   Tobacco Use   Smoking status: Never   Smokeless tobacco: Never  Vaping Use   Vaping status: Never Used  Substance Use Topics   Alcohol use: Yes   Drug use: No     Allergies   Patient has no known allergies.   Review of Systems Review of Systems Per HPI  Physical Exam Triage Vital Signs ED Triage  Vitals  Encounter Vitals Group     BP 03/17/23 0912 129/85     Systolic BP Percentile --      Diastolic BP Percentile --      Pulse Rate 03/17/23 0912 88     Resp 03/17/23 0912 13     Temp 03/17/23 0912 98.4 F (36.9 C)     Temp Source 03/17/23 0912 Oral     SpO2 03/17/23 0912 98 %     Weight --      Height --      Head Circumference --      Peak Flow --      Pain Score 03/17/23 0917 0     Pain Loc --      Pain Education --      Exclude from Growth Chart --    No data found.  Updated Vital Signs BP 129/85 (BP Location: Right Arm)   Pulse 88   Temp 98.4 F (36.9 C) (Oral)   Resp 13   LMP 03/01/2023 (Within Days)   SpO2 98%   Visual Acuity Right Eye Distance:   Left Eye Distance:    Bilateral Distance:    Right Eye Near:   Left Eye Near:    Bilateral Near:     Physical Exam Vitals and nursing note reviewed.  Constitutional:      Appearance: Normal appearance. She is not ill-appearing.  HENT:     Head: Atraumatic.     Mouth/Throat:     Mouth: Mucous membranes are moist.     Pharynx: Oropharynx is clear.  Eyes:     Extraocular Movements: Extraocular movements intact.     Conjunctiva/sclera: Conjunctivae normal.  Cardiovascular:     Rate and Rhythm: Normal rate and regular rhythm.     Heart sounds: Normal heart sounds.  Pulmonary:     Effort: Pulmonary effort is normal.     Breath sounds: Normal breath sounds.  Abdominal:     General: There is no distension.     Palpations: Abdomen is soft.     Tenderness: There is no abdominal tenderness. There is no right CVA tenderness, left CVA tenderness or guarding.     Comments: Mildly hyperactive bowel sounds  Musculoskeletal:        General: Tenderness present. Normal range of motion.     Cervical back: Normal range of motion and neck supple.     Comments: Mild anterior left shoulder tenderness to palpation.  Range of motion intact, grip strength full and equal bilateral hands  Skin:    General: Skin is warm and dry.  Neurological:     Mental Status: She is alert and oriented to person, place, and time.     Comments: Left upper extremity neurovascularly intact  Psychiatric:        Mood and Affect: Mood normal.        Thought Content: Thought content normal.        Judgment: Judgment normal.      UC Treatments / Results  Labs (all labs ordered are listed, but only abnormal results are displayed) Labs Reviewed - No data to display  EKG   Radiology No results found.  Procedures Procedures (including critical care time)  Medications Ordered in UC Medications - No data to display  Initial Impression / Assessment and Plan / UC Course  I have reviewed the triage vital signs and the nursing  notes.  Pertinent labs & imaging results that were available during my care of  the patient were reviewed by me and considered in my medical decision making (see chart for details).     Treat for traveler's diarrhea with Cipro, Flexeril, anti-inflammatory pain medications for shoulder strain.  Discussed supportive over-the-counter measures, home care and return precautions.  Final Clinical Impressions(s) / UC Diagnoses   Final diagnoses:  Strain of left shoulder, initial encounter  Traveler's diarrhea   Discharge Instructions   None    ED Prescriptions     Medication Sig Dispense Auth. Provider   ciprofloxacin (CIPRO) 500 MG tablet Take 1 tablet (500 mg total) by mouth 2 (two) times daily. 14 tablet Particia Nearing, [place]   cyclobenzaprine (FLEXERIL) 10 MG tablet Take 1 tablet (10 mg total) by mouth 3 (three) times daily as needed for muscle spasms. Do not drink alcohol or drive while taking this medication.  May cause drowsiness. 15 tablet Particia Nearing, [place]      PDMP not reviewed this encounter.   Particia Nearing, [place] 03/21/23 1348

## 2023-04-28 ENCOUNTER — Other Ambulatory Visit: Payer: Self-pay | Admitting: Family Medicine

## 2023-04-28 DIAGNOSIS — I10 Essential (primary) hypertension: Secondary | ICD-10-CM

## 2023-05-04 ENCOUNTER — Encounter: Payer: Self-pay | Admitting: Family Medicine

## 2023-06-21 ENCOUNTER — Ambulatory Visit (INDEPENDENT_AMBULATORY_CARE_PROVIDER_SITE_OTHER): Payer: BC Managed Care – PPO | Admitting: Family Medicine

## 2023-06-21 VITALS — BP 136/85 | HR 104 | Ht 63.0 in | Wt 151.1 lb

## 2023-06-21 DIAGNOSIS — E038 Other specified hypothyroidism: Secondary | ICD-10-CM | POA: Diagnosis not present

## 2023-06-21 DIAGNOSIS — R7301 Impaired fasting glucose: Secondary | ICD-10-CM

## 2023-06-21 DIAGNOSIS — E7849 Other hyperlipidemia: Secondary | ICD-10-CM

## 2023-06-21 DIAGNOSIS — E559 Vitamin D deficiency, unspecified: Secondary | ICD-10-CM

## 2023-06-21 DIAGNOSIS — R7303 Prediabetes: Secondary | ICD-10-CM | POA: Insufficient documentation

## 2023-06-21 DIAGNOSIS — I10 Essential (primary) hypertension: Secondary | ICD-10-CM | POA: Diagnosis not present

## 2023-06-21 NOTE — Assessment & Plan Note (Signed)
For managing prediabetes, I recommend the following lifestyle changes:  Reduce Intake of High-Sugar Foods and Beverages: Limit foods and drinks high in sugar to help regulate blood sugar levels. Increase Consumption of Nutrient-Rich Foods: Focus on incorporating more fruits, vegetables, and whole grains into your diet. Choose Lean Proteins: Opt for lean proteins such as chicken, fish, beans, and legumes. Select Low-Fat Dairy Products: Choose low-fat or non-fat dairy options. Minimize Saturated Fats, Trans Fats, and Cholesterol: Reduce intake of foods high in saturated fats, trans fatty acids, and cholesterol. Engage in Regular Physical Activity: Aim for at least 30 minutes of brisk walking or other moderate activity at least 5 days a week.

## 2023-06-21 NOTE — Assessment & Plan Note (Signed)
Controlled Asymptomatic in the clinic Low-sodium diet increase physical activity encourage Encouraged to continue taking amlodipine 10 mg daily as prescribed BP Readings from Last 3 Encounters:  06/21/23 136/85  03/17/23 129/85  02/16/23 129/77

## 2023-06-21 NOTE — Progress Notes (Addendum)
Established Patient Office Visit  Subjective:  Patient ID: Hannah Sampson, female    DOB: 11/07/2000  Age: 22 y.o. MRN: [id number]  CC:  Chief Complaint  Patient presents with   Care Management    5 month f/u    HPI Hannah Sampson is a 22 y.o. female with past medical history of hypertension, prediabetes presents for f/u of  chronic medical conditions. For the details of today's visit, please refer to the assessment and plan.     Past Medical History:  Diagnosis Date   Anemia    COVID 10/22/2020    History reviewed. No pertinent surgical history.  Family History  Problem Relation Age of Onset   Hypertension Father    Diabetes Paternal Grandfather    Diabetes Maternal Grandmother     Social History   Socioeconomic History   Marital status: Single    Spouse name: Not on file   Number of children: Not on file   Years of education: Not on file   Highest education level: Associate degree: academic program  Occupational History   Not on file  Tobacco Use   Smoking status: Never   Smokeless tobacco: Never  Vaping Use   Vaping status: Never Used  Substance and Sexual Activity   Alcohol use: Yes   Drug use: No   Sexual activity: Yes    Birth control/protection: Condom, I.U.D.  Other Topics Concern   Not on file  Social History Narrative   Lives with mom, dad and brother   No pets       Enjoys reading-fiction favorite Artist, enjoys working to get outside house      Diet: meat, veggies, fruits (red meats, not big sweets, chips yes)   Caffeine: soda week   Water: 4 bottles daily      Wear seat belt    Wear sunscreen   Smoke detectors at home     Does not use phone while driving    Social Determinants of Health   Financial Resource Strain: Low Risk  (06/21/2023)   Overall Financial Resource Strain (CARDIA)    Difficulty of Paying Living Expenses: Not hard at all  Food Insecurity: No Food Insecurity (06/21/2023)   Hunger Vital Sign     Worried About Running Out of Food in the Last Year: Never true    Ran Out of Food in the Last Year: Never true  Transportation Needs: No Transportation Needs (06/21/2023)   PRAPARE - Administrator, Civil Service (Medical): No    Lack of Transportation (Non-Medical): No  Physical Activity: Insufficiently Active (06/21/2023)   Exercise Vital Sign    Days of Exercise per Week: 3 days    Minutes of Exercise per Session: 30 min  Stress: No Stress Concern Present (06/21/2023)   [employer] of Occupational Health - Occupational Stress Questionnaire    Feeling of Stress : Not at all  Social Connections: Moderately Isolated (06/21/2023)   Social Connection and Isolation Panel [NHANES]    Frequency of Communication with Friends and Family: More than three times a week    Frequency of Social Gatherings with Friends and Family: Twice a week    Attends Religious Services: More than 4 times per year    Active Member of [employer] or Organizations: No    Attends Banker Meetings: Never    Marital Status: Never married  Intimate Partner Violence: Not At Risk (12/21/2022)   Humiliation, Afraid, Rape, and Kick questionnaire  Fear of Current or Ex-Partner: No    Emotionally Abused: No    Physically Abused: No    Sexually Abused: No    Outpatient Medications Prior to Visit  Medication Sig Dispense Refill   amLODipine (NORVASC) 10 MG tablet TAKE 1 TABLET(10 MG) BY MOUTH DAILY 90 tablet 1   levonorgestrel (KYLEENA) 19.5 MG IUD 1 each by Intrauterine route once.     ciprofloxacin (CIPRO) 500 MG tablet Take 1 tablet (500 mg total) by mouth 2 (two) times daily. 14 tablet 0   cyclobenzaprine (FLEXERIL) 10 MG tablet Take 1 tablet (10 mg total) by mouth 3 (three) times daily as needed for muscle spasms. Do not drink alcohol or drive while taking this medication.  May cause drowsiness. 15 tablet 0   No facility-administered medications prior to visit.    No Known  Allergies  ROS Review of Systems  Constitutional:  Negative for chills and fever.  Eyes:  Negative for visual disturbance.  Respiratory:  Negative for chest tightness and shortness of breath.   Neurological:  Negative for dizziness and headaches.      Objective:    Physical Exam HENT:     Head: Normocephalic.     Mouth/Throat:     Mouth: Mucous membranes are moist.  Cardiovascular:     Rate and Rhythm: Normal rate.     Heart sounds: Normal heart sounds.  Pulmonary:     Effort: Pulmonary effort is normal.     Breath sounds: Normal breath sounds.  Neurological:     Mental Status: She is alert.     BP 136/85   Pulse (!) 104   Ht 5\' 3"  (1.6 m)   Wt 151 lb 1.3 oz (68.5 kg)   SpO2 97%   BMI 26.76 kg/m  Wt Readings from Last 3 Encounters:  06/21/23 151 lb 1.3 oz (68.5 kg)  02/16/23 169 lb 0.6 oz (76.7 kg)  02/09/23 174 lb (78.9 kg)    Lab Results  Component Value Date   TSH 0.855 02/16/2023   Lab Results  Component Value Date   WBC 3.8 02/16/2023   HGB 13.4 02/16/2023   HCT 41.1 02/16/2023   MCV 91 02/16/2023   PLT 220 02/16/2023   Lab Results  Component Value Date   NA 139 02/16/2023   K 4.7 02/16/2023   CO2 23 02/16/2023   GLUCOSE 96 02/16/2023   BUN 16 02/16/2023   CREATININE 0.85 02/16/2023   BILITOT 0.3 02/16/2023   ALKPHOS 84 02/16/2023   AST 9 02/16/2023   ALT 8 02/16/2023   PROT 7.0 02/16/2023   ALBUMIN 4.4 02/16/2023   CALCIUM 9.5 02/16/2023   EGFR 98 02/16/2023   Lab Results  Component Value Date   CHOL 195 02/16/2023   Lab Results  Component Value Date   HDL 51 02/16/2023   Lab Results  Component Value Date   LDLCALC 134 (H) 02/16/2023   Lab Results  Component Value Date   TRIG 56 02/16/2023   Lab Results  Component Value Date   CHOLHDL 3.8 02/16/2023   Lab Results  Component Value Date   HGBA1C 5.7 (H) 02/16/2023      Assessment & Plan:  Hypertension, essential Assessment & Plan: Controlled Asymptomatic in the  clinic Low-sodium diet increase physical activity encourage Encouraged to continue taking amlodipine 10 mg daily as prescribed BP Readings from Last 3 Encounters:  06/21/23 136/85  03/17/23 129/85  02/16/23 129/77      Prediabetes Assessment & Plan:  For managing prediabetes, I recommend the following lifestyle changes:  Reduce Intake of High-Sugar Foods and Beverages: Limit foods and drinks high in sugar to help regulate blood sugar levels. Increase Consumption of Nutrient-Rich Foods: Focus on incorporating more fruits, vegetables, and whole grains into your diet. Choose Lean Proteins: Opt for lean proteins such as chicken, fish, beans, and legumes. Select Low-Fat Dairy Products: Choose low-fat or non-fat dairy options. Minimize Saturated Fats, Trans Fats, and Cholesterol: Reduce intake of foods high in saturated fats, trans fatty acids, and cholesterol. Engage in Regular Physical Activity: Aim for at least 30 minutes of brisk walking or other moderate activity at least 5 days a week.    IFG (impaired fasting glucose) -     Hemoglobin A1c  Vitamin D deficiency -     VITAMIN D 25 Hydroxy (Vit-D Deficiency, Fractures)  TSH (thyroid-stimulating hormone deficiency) -     TSH + free T4  Other hyperlipidemia -     Lipid panel -     CMP14+EGFR -     CBC with Differential/Platelet   Note: This chart has been completed using Engineer, civil (consulting) software, and while attempts have been made to ensure accuracy, certain words and phrases may not be transcribed as intended.   Follow-up: Return in about 5 months (around 11/19/2023).   Gilmore Laroche, FNP

## 2023-06-21 NOTE — Patient Instructions (Addendum)
I appreciate the opportunity to provide care to you today!    Follow up:  5 months  Labs: please stop by the lab during the week to get your blood drawn (CBC, CMP, TSH, Lipid profile, HgA1c, Vit D)   Great efforts on your weight loss--keep up the good work!   Attached with your AVS, you will find valuable resources for self-education. I highly recommend dedicating some time to thoroughly examine them.   Please continue to a heart-healthy diet and increase your physical activities. Try to exercise for at least five days a week.    It was a pleasure to see you and I look forward to continuing to work together on your health and well-being. Please do not hesitate to call the office if you need care or have questions about your care.  In case of emergency, please visit the Emergency Department for urgent care, or contact our clinic at [phone] to schedule an appointment. We're here to help you!   Have a wonderful day and week. With Gratitude, Gilmore Laroche MSN, FNP-BC

## 2023-11-22 ENCOUNTER — Ambulatory Visit: Payer: BC Managed Care – PPO | Admitting: Family Medicine

## 2023-12-26 ENCOUNTER — Encounter: Admitting: Family Medicine

## 2024-01-19 ENCOUNTER — Ambulatory Visit

## 2024-01-19 ENCOUNTER — Other Ambulatory Visit (HOSPITAL_COMMUNITY)
Admission: RE | Admit: 2024-01-19 | Discharge: 2024-01-19 | Disposition: A | Discharge status: Home / Self Care | Source: Ambulatory Visit | Attending: Obstetrics & Gynecology | Admitting: Obstetrics & Gynecology

## 2024-01-19 DIAGNOSIS — Z113 Encounter for screening for infections with a predominantly sexual mode of transmission: Secondary | ICD-10-CM

## 2024-01-19 NOTE — Progress Notes (Signed)
   NURSE VISIT- VAGINITIS/STD/POC  SUBJECTIVE:  Hannah Sampson is a 23 y.o. G0P0000 GYN patientfemale here for a vaginal swab for STD screen.  She reports the following symptoms: none for 0 days. Denies abnormal vaginal bleeding, significant pelvic pain, fever, or UTI symptoms.  OBJECTIVE:  There were no vitals taken for this visit.  Appears well, in no apparent distress  ASSESSMENT: Vaginal swab for STD screen  PLAN: Self-collected vaginal probe for Gonorrhea, Chlamydia, Trichomonas, Bacterial Vaginosis, Yeast sent to lab Treatment: to be determined once results are received Follow-up as needed if symptoms persist/worsen, or new symptoms develop  Alan LITTIE Fischer  01/19/2024 11:31 AM

## 2024-01-23 ENCOUNTER — Ambulatory Visit: Payer: Self-pay | Admitting: Women's Health

## 2024-01-23 LAB — CERVICOVAGINAL ANCILLARY ONLY
Bacterial Vaginitis (gardnerella): POSITIVE — AB
Candida Glabrata: NEGATIVE
Candida Vaginitis: NEGATIVE
Chlamydia: NEGATIVE
Comment: NEGATIVE
Comment: NEGATIVE
Comment: NEGATIVE
Comment: NEGATIVE
Comment: NEGATIVE
Comment: NORMAL
Neisseria Gonorrhea: NEGATIVE
Trichomonas: NEGATIVE

## 2024-01-23 MED ORDER — METRONIDAZOLE 500 MG PO TABS: 500.0000 mg | ORAL_TABLET | Freq: Two times a day (BID) | ORAL | 0 refills | Status: AC

## 2024-02-10 ENCOUNTER — Telehealth: Payer: Self-pay | Admitting: Family Medicine

## 2024-02-10 NOTE — Telephone Encounter (Signed)
 Copied from CRM #[id number]. Topic: Appointments - Scheduling Inquiry for Clinic >> Feb 10, 2024  8:32 AM Hannah Sampson wrote: Reason for CRM: The patient called in asking if she is supposed to come in early to get her blood work done. Her physical is scheduled for Monday. She even stated she could come today if necessary. Please assist patient as soon as possible.

## 2024-02-10 NOTE — Telephone Encounter (Signed)
 Spoke with patient informed her she had lab orders in so she will come by today to have those done

## 2024-02-11 ENCOUNTER — Ambulatory Visit: Payer: Self-pay | Admitting: Family Medicine

## 2024-02-11 LAB — CBC WITH DIFFERENTIAL/PLATELET
Basophils Absolute: 0 x10E3/uL (ref 0.0–0.2)
Basos: 1 %
EOS (ABSOLUTE): 0.1 x10E3/uL (ref 0.0–0.4)
Eos: 2 %
Hematocrit: 39.6 % (ref 34.0–46.6)
Hemoglobin: 12.6 g/dL (ref 11.1–15.9)
Immature Grans (Abs): 0 x10E3/uL (ref 0.0–0.1)
Immature Granulocytes: 0 %
Lymphocytes Absolute: 2 x10E3/uL (ref 0.7–3.1)
Lymphs: 51 %
MCH: 30.5 pg (ref 26.6–33.0)
MCHC: 31.8 g/dL (ref 31.5–35.7)
MCV: 96 fL (ref 79–97)
Monocytes Absolute: 0.3 x10E3/uL (ref 0.1–0.9)
Monocytes: 7 %
Neutrophils Absolute: 1.5 x10E3/uL (ref 1.4–7.0)
Neutrophils: 39 %
Platelets: 174 x10E3/uL (ref 150–450)
RBC: 4.13 x10E6/uL (ref 3.77–5.28)
RDW: 11.9 % (ref 11.7–15.4)
WBC: 3.9 x10E3/uL (ref 3.4–10.8)

## 2024-02-11 LAB — HEMOGLOBIN A1C
Est. average glucose Bld gHb Est-mCnc: 105 mg/dL
Hgb A1c MFr Bld: 5.3 % (ref 4.8–5.6)

## 2024-02-11 LAB — CMP14+EGFR
ALT: 16 IU/L (ref 0–32)
AST: 22 IU/L (ref 0–40)
Albumin: 4.2 g/dL (ref 4.0–5.0)
Alkaline Phosphatase: 59 IU/L (ref 44–121)
BUN/Creatinine Ratio: 16 (ref 9–23)
BUN: 11 mg/dL (ref 6–20)
Bilirubin Total: 0.4 mg/dL (ref 0.0–1.2)
CO2: 22 mmol/L (ref 20–29)
Calcium: 9.2 mg/dL (ref 8.7–10.2)
Chloride: 101 mmol/L (ref 96–106)
Creatinine, Ser: 0.67 mg/dL (ref 0.57–1.00)
Globulin, Total: 2.2 g/dL (ref 1.5–4.5)
Glucose: 85 mg/dL (ref 70–99)
Potassium: 4.5 mmol/L (ref 3.5–5.2)
Sodium: 137 mmol/L (ref 134–144)
Total Protein: 6.4 g/dL (ref 6.0–8.5)
eGFR: 124 mL/min/1.73 (ref >59)

## 2024-02-11 LAB — LIPID PANEL
Chol/HDL Ratio: 3 ratio (ref 0.0–4.4)
Cholesterol, Total: 172 mg/dL (ref 100–199)
HDL: 57 mg/dL (ref >39)
LDL Chol Calc (NIH): 106 mg/dL — ABNORMAL HIGH (ref 0–99)
Triglycerides: 42 mg/dL (ref 0–149)
VLDL Cholesterol Cal: 9 mg/dL (ref 5–40)

## 2024-02-11 LAB — TSH+FREE T4
Free T4: 1.5 ng/dL (ref 0.82–1.77)
TSH: 0.752 u[IU]/mL (ref 0.450–4.500)

## 2024-02-11 LAB — VITAMIN D 25 HYDROXY (VIT D DEFICIENCY, FRACTURES): Vit D, 25-Hydroxy: 34.5 ng/mL (ref 30.0–100.0)

## 2024-02-13 ENCOUNTER — Ambulatory Visit (INDEPENDENT_AMBULATORY_CARE_PROVIDER_SITE_OTHER): Admitting: Family Medicine

## 2024-02-13 ENCOUNTER — Encounter: Payer: Self-pay | Admitting: Family Medicine

## 2024-02-13 VITALS — BP 133/77 | HR 94 | Ht 63.0 in | Wt 152.2 lb

## 2024-02-13 DIAGNOSIS — E038 Other specified hypothyroidism: Secondary | ICD-10-CM | POA: Diagnosis not present

## 2024-02-13 DIAGNOSIS — Z0001 Encounter for general adult medical examination with abnormal findings: Secondary | ICD-10-CM | POA: Diagnosis not present

## 2024-02-13 DIAGNOSIS — R7301 Impaired fasting glucose: Secondary | ICD-10-CM | POA: Diagnosis not present

## 2024-02-13 DIAGNOSIS — E559 Vitamin D deficiency, unspecified: Secondary | ICD-10-CM | POA: Diagnosis not present

## 2024-02-13 DIAGNOSIS — Z23 Encounter for immunization: Secondary | ICD-10-CM

## 2024-02-13 DIAGNOSIS — E7849 Other hyperlipidemia: Secondary | ICD-10-CM

## 2024-02-13 NOTE — Patient Instructions (Addendum)
 I appreciate the opportunity to provide care to you today!    Follow up:  1 year for CPE  Labs: please stop by the lab 2-3 days before your next appt to get your blood drawn (CBC, CMP, TSH, Lipid profile, HgA1c, Vit D)  For a Healthier YOU, I Recommend: Reducing your intake of sugar, sodium, carbohydrates, and saturated fats. Increasing your fiber intake by incorporating more whole grains, fruits, and vegetables into your meals. Setting healthy goals with a focus on lowering your consumption of carbs, sugar, and unhealthy fats. Adding variety to your diet by including a wide range of fruits and vegetables. Cutting back on soda and limiting processed foods as much as possible. Staying active: In addition to taking your weight loss medication, aim for at least 150 minutes of moderate-intensity physical activity each week for optimal results.      Please continue to a heart-healthy diet and increase your physical activities. Try to exercise for at least five days a week.    It was a pleasure to see you and I look forward to continuing to work together on your health and well-being. Please do not hesitate to call the office if you need care or have questions about your care.  In case of emergency, please visit the Emergency Department for urgent care, or contact our clinic at [phone] to schedule an appointment. We're here to help you!   Have a wonderful day and week. With Gratitude, Veera Stapleton MSN, FNP-BC

## 2024-02-13 NOTE — Assessment & Plan Note (Signed)
 Patient educated on CDC recommendation for the vaccine. Verbal consent was obtained from the patient, vaccine administered by nurse, no sign of adverse reactions noted at this time. Patient education on arm soreness and use of tylenol or ibuprofen for this patient  was discussed. Patient educated on the signs and symptoms of adverse effect and advise to contact the office if they occur.

## 2024-02-13 NOTE — Progress Notes (Signed)
 Complete physical exam  Patient: Hannah Sampson   DOB: 2000/10/04   23 y.o. Female  MRN: [id number]  Subjective:    Chief Complaint  Patient presents with   Annual Exam    Hannah Sampson is a 23 y.o. female who presents today for a complete physical exam. She reports consuming a general diet. The patient does not participate in regular exercise at present. Work out 3-4 times day weekly She generally feels well. She reports sleeping well. She does have additional problems to discuss today.    Most recent fall risk assessment:    02/13/2024    3:01 PM  Fall Risk   Falls in the past year? 0  Number falls in past yr: 0  Injury with Fall? 0     Most recent depression screenings:    02/13/2024    2:59 PM 06/21/2023    8:09 AM  PHQ 2/9 Scores  PHQ - 2 Score 0 0  PHQ- 9 Score 0 0    Dental: No current dental problems and Last dental visit: 11/2023  Patient Active Problem List   Diagnosis Date Noted   Encounter for immunization 02/13/2024   Prediabetes 06/21/2023   IUD (intrauterine device) in place 02/09/2023   IUD check up 02/09/2023   Screening examination for STD (sexually transmitted disease) 12/21/2022   General counseling and advice for contraceptive management 12/21/2022   Pregnancy examination or test, negative result 12/21/2022   Abnormal uterine bleeding (AUB) 10/05/2022   Encounter for annual general medical examination with abnormal findings in adult 06/24/2022   Cervical cancer screening 03/25/2022   Encounter for general adult medical examination with abnormal findings 12/16/2020   Enlarged thyroid  gland 12/16/2020   Hypertension, essential 10/22/2020   Heart murmur 11/20/2013   Past Medical History:  Diagnosis Date   Anemia    COVID 10/22/2020   History reviewed. No pertinent surgical history. Social History   Tobacco Use   Smoking status: Never   Smokeless tobacco: Never  Vaping Use   Vaping status: Never Used  Substance Use Topics   Alcohol  use: Yes   Drug use: No   Social History   Socioeconomic History   Marital status: Single    Spouse name: Not on file   Number of children: Not on file   Years of education: Not on file   Highest education level: Associate degree: occupational, Scientist, product/process development, or vocational program  Occupational History   Not on file  Tobacco Use   Smoking status: Never   Smokeless tobacco: Never  Vaping Use   Vaping status: Never Used  Substance and Sexual Activity   Alcohol use: Yes   Drug use: No   Sexual activity: Yes    Birth control/protection: Condom, I.U.D.  Other Topics Concern   Not on file  Social History Narrative   Lives with mom, dad and brother   No pets       Enjoys reading-fiction favorite Artist, enjoys working to get outside house      Diet: meat, veggies, fruits (red meats, not big sweets, chips yes)   Caffeine: soda week   Water: 4 bottles daily      Wear seat belt    Wear sunscreen   Smoke detectors at home     Does not use phone while driving    Social Drivers of Health   Financial Resource Strain: Low Risk  (02/11/2024)   Overall Financial Resource Strain (CARDIA)    Difficulty of Paying  Living Expenses: Not hard at all  Food Insecurity: No Food Insecurity (02/11/2024)   Hunger Vital Sign    Worried About Running Out of Food in the Last Year: Never true    Ran Out of Food in the Last Year: Never true  Transportation Needs: No Transportation Needs (02/11/2024)   PRAPARE - Administrator, Civil Service (Medical): No    Lack of Transportation (Non-Medical): No  Physical Activity: Sufficiently Active (02/11/2024)   Exercise Vital Sign    Days of Exercise per Week: 3 days    Minutes of Exercise per Session: 50 min  Stress: No Stress Concern Present (02/11/2024)   [employer] of Occupational Health - Occupational Stress Questionnaire    Feeling of Stress: Not at all  Social Connections: Moderately Isolated (02/11/2024)   Social  Connection and Isolation Panel    Frequency of Communication with Friends and Family: More than three times a week    Frequency of Social Gatherings with Friends and Family: Twice a week    Attends Religious Services: More than 4 times per year    Active Member of [employer] or Organizations: No    Attends Engineer, structural: Not on file    Marital Status: Never married  Intimate Partner Violence: Not At Risk (12/21/2022)   Humiliation, Afraid, Rape, and Kick questionnaire    Fear of Current or Ex-Partner: No    Emotionally Abused: No    Physically Abused: No    Sexually Abused: No   Family Status  Relation Name Status   Mother  Alive   Father  Alive   Brother  Alive   PGF  Alive   PGM  Alive   MGM  Deceased   MGF  Alive  No partnership data on file   Family History  Problem Relation Age of Onset   Hypertension Father    Diabetes Paternal Grandfather    Diabetes Maternal Grandmother    No Known Allergies    Patient Care Team: Kenzlei Runions, FNP as PCP - General (Family Medicine)   Outpatient Medications Prior to Visit  Medication Sig   amLODipine  (NORVASC ) 10 MG tablet TAKE 1 TABLET(10 MG) BY MOUTH DAILY   levonorgestrel  (KYLEENA ) 19.5 MG IUD 1 each by Intrauterine route once.   metroNIDAZOLE  (FLAGYL ) 500 MG tablet Take 1 tablet (500 mg total) by mouth 2 (two) times daily.   No facility-administered medications prior to visit.    Review of Systems  Constitutional:  Negative for chills, fever and malaise/fatigue.  HENT:  Negative for congestion and sinus pain.   Eyes:  Negative for pain, discharge and redness.  Respiratory:  Negative for cough, sputum production and shortness of breath.   Cardiovascular:  Negative for chest pain, palpitations, claudication and leg swelling.  Gastrointestinal:  Negative for diarrhea, heartburn and nausea.  Genitourinary:  Negative for flank pain and frequency.  Musculoskeletal:  Negative for back pain and joint pain.  Skin:   Negative for itching.  Neurological:  Negative for dizziness, seizures and headaches.  Endo/Heme/Allergies:  Negative for environmental allergies.  Psychiatric/Behavioral:  Negative for memory loss. The patient does not have insomnia.        Objective:    BP 133/77   Pulse 94   Ht 5' 3 (1.6 m)   Wt 152 lb 4 oz (69.1 kg)   SpO2 97%   BMI 26.97 kg/m    Physical Exam HENT:     Head: Normocephalic.  Left Ear: External ear normal.     Mouth/Throat:     Mouth: Mucous membranes are moist.  Eyes:     Extraocular Movements: Extraocular movements intact.     Pupils: Pupils are equal, round, and reactive to light.  Cardiovascular:     Rate and Rhythm: Normal rate and regular rhythm.     Heart sounds: No murmur heard. Pulmonary:     Effort: Pulmonary effort is normal.     Breath sounds: Normal breath sounds.  Abdominal:     Palpations: Abdomen is soft.     Tenderness: There is no right CVA tenderness or left CVA tenderness.  Musculoskeletal:     Right lower leg: No edema.     Left lower leg: No edema.  Skin:    Findings: No lesion or rash.  Neurological:     Mental Status: She is alert and oriented to person, place, and time.     GCS: GCS eye subscore is 4. GCS verbal subscore is 5. GCS motor subscore is 6.     Cranial Nerves: No facial asymmetry.     Sensory: No sensory deficit.     Motor: No weakness.     Coordination: Coordination normal. Finger-Nose-Finger Test normal.     Gait: Gait normal.  Psychiatric:        Judgment: Judgment normal.     No results found for any visits on 02/13/24.     Assessment & Plan:    Routine Health Maintenance and Physical Exam  Immunization History  Administered Date(s) Administered   HPV 9-valent 12/17/2021, 06/24/2022, 10/05/2022   Hepatitis B, ADULT 02/13/2024   Influenza Inj Mdck Quad Pf 04/17/2019   Influenza,inj,Quad PF,6+ Mos 04/26/2017, 04/17/2018, 03/25/2022   Influenza-Unspecified 04/17/2019, 04/13/2023   Moderna  Sars-Covid-2 Vaccination 09/21/2019, 10/19/2019   Tdap 12/17/2021   Unspecified SARS-COV-2 Vaccination 04/13/2023    Health Maintenance  Topic Date Due   INFLUENZA VACCINE  02/17/2024   Hepatitis B Vaccines (2 of 3 - 19+ 3-dose series) 03/12/2024   Cervical Cancer Screening (Pap smear)  03/25/2025   DTaP/Tdap/Td (2 - Td or Tdap) 12/18/2031   HPV VACCINES  Completed   COVID-19 Vaccine  Completed   Hepatitis C Screening  Completed   HIV Screening  Completed   Meningococcal B Vaccine  Aged Out    Discussed health benefits of physical activity, and encouraged her to engage in regular exercise appropriate for her age and condition.  Encounter for annual general medical examination with abnormal findings in adult Assessment & Plan: Physical exam as documented Discussed heart-healthy diet  Encouraged to Exercise: If you are able: 30 -60 minutes a day ,4 days a week, or 150 minutes a week. The longer the better. Combine stretch, strength, and aerobic activities Encourage to eat whole Food, Plant Predominant Nutrition is highly recommended: Eat Plenty of vegetables, Mushrooms, fruits, Legumes, Whole Grains, Nuts, seeds in lieu of processed meats, processed snacks/pastries red meat, poultry, eggs.  Will f/u in 1 year for CPE      Encounter for immunization Assessment & Plan: Patient educated on CDC recommendation for the vaccine. Verbal consent was obtained from the patient, vaccine administered by nurse, no sign of adverse reactions noted at this time. Patient education on arm soreness and use of tylenol or ibuprofen  for this patient  was discussed. Patient educated on the signs and symptoms of adverse effect and advise to contact the office if they occur.   Orders: -     Hepatitis B  vaccine adult IM  IFG (impaired fasting glucose) -     Hemoglobin A1c  Vitamin D  deficiency -     VITAMIN D  25 Hydroxy (Vit-D Deficiency, Fractures)  TSH (thyroid -stimulating hormone deficiency) -      TSH + free T4  Other hyperlipidemia -     Lipid panel -     CMP14+EGFR -     CBC with Differential/Platelet  Note: This chart has been completed using Engineer, civil (consulting) software, and while attempts have been made to ensure accuracy, certain words and phrases may not be transcribed as intended.    No follow-ups on file.     Chayah Mckee, FNP

## 2024-02-13 NOTE — Assessment & Plan Note (Signed)

## 2024-02-15 NOTE — Progress Notes (Signed)
 [facility] - Clinic Note  02/29/2024   CHIEF COMPLAINT Patient presents for Retina Evaluation  HISTORY OF PRESENT ILLNESS: Hannah Sampson is a 23 y.o. female who presents to the clinic today for:  HPI     Retina Evaluation   In right eye.  Associated Symptoms Negative for Flashes, Floaters, Distortion, Blind Spot, Pain, Redness, Photophobia, Glare, Trauma, Scalp Tenderness, Jaw Claudication, Shoulder/Hip pain, Fever, Weight Loss and Fatigue.  I, the attending physician,  performed the HPI with the patient and updated documentation appropriately.        Comments   Referral Dr. Moishe, [facility], questionable heme OD inf macula. Pt states no concerns with vision, she has been looking for floaters but isn't noticing anything. Pt denies FOL/pain. Pt does not use ATS. Pt went for a follow up after the pre-diabetic concern and she was told she is not pre-diabetic.      Last edited by Hannah Rogue, MD on 02/29/2024  9:01 PM.    Pt states she saw Dr. Moishe who was a virtual visit. Pt was told there was a spot in OD but unsure what it meant. No history of any eye problems. Hx of HTN-on norvasc . Pt is a Merchandiser, retail at [employer].   Referring physician: [facility]  [address] [place],  [place] [id number]  HISTORICAL INFORMATION:  Selected notes from the MEDICAL RECORD NUMBER Referred by Dr. Moishe at [facility] for possible retinal hemorrhage OD LEE:  Ocular Hx- PMH-   CURRENT MEDICATIONS: No current outpatient medications on file. (Ophthalmic Drugs)   No current facility-administered medications for this visit. (Ophthalmic Drugs)   Current Outpatient Medications (Other)  Medication Sig   amLODipine  (NORVASC ) 10 MG tablet TAKE 1 TABLET(10 MG) BY MOUTH DAILY   levonorgestrel  (KYLEENA ) 19.5 MG IUD 1 each by Intrauterine route once.   metroNIDAZOLE  (FLAGYL ) 500 MG tablet Take 1 tablet (500 mg total) by mouth 2 (two)  times daily. (Patient not taking: Reported on 02/29/2024)   No current facility-administered medications for this visit. (Other)   REVIEW OF SYSTEMS: ROS   Negative for: Constitutional, Gastrointestinal, Neurological, Skin, Genitourinary, Musculoskeletal, HENT, Endocrine, Cardiovascular, Eyes, Respiratory, Psychiatric, Allergic/Imm, Heme/Lymph Last edited by Hannah Sampson, COT on 02/29/2024  8:58 AM.     ALLERGIES No Known Allergies PAST MEDICAL HISTORY Past Medical History:  Diagnosis Date   Anemia    COVID 10/22/2020   History reviewed. No pertinent surgical history. FAMILY HISTORY Family History  Problem Relation Age of Onset   Hypertension Father    Diabetes Paternal Grandfather    Diabetes Maternal Grandmother    SOCIAL HISTORY Social History   Tobacco Use   Smoking status: Never   Smokeless tobacco: Never  Vaping Use   Vaping status: Never Used  Substance Use Topics   Alcohol use: Yes   Drug use: No       OPHTHALMIC EXAM:  Base Eye Exam     Visual Acuity (Snellen - Linear)       Right Left   Dist cc 20/20 +1 20/20    Correction: Glasses         Tonometry (Tonopen, 8:52 AM)       Right Left   Pressure 17 14         Pupils       Pupils Dark Light Shape React APD   Right PERRL 3 2 Round Brisk None   Left PERRL 3 2 Round Brisk None  Visual Fields       Left Right    Full Full         Extraocular Movement       Right Left    Full, Ortho Full, Ortho         Neuro/Psych     Oriented x3: Yes         Dilation     Both eyes: 1.0% Mydriacyl, 2.5% Phenylephrine @ 8:54 AM           Slit Lamp and Fundus Exam     Slit Lamp Exam       Right Left   Lids/Lashes Normal Normal   Conjunctiva/Sclera mild melanosis mild melanosis   Cornea Clear Clear   Anterior Chamber Deep and clear Deep and clear   Iris Round and dilated Round and dilated   Lens Clear Clear   Anterior Vitreous mild syneresis mild syneresis          Fundus Exam       Right Left   Disc Pink and sharp Pink and sharp   C/D Ratio 0.4 0.3   Macula Flat, good foveal reflex, no heme or edema Flat, good foveal reflex, no heme or edema   Vessels Normal Normal   Periphery Attached, no heme, focal pigmented CR scar just outside IT arcades. Attached, no heme           Refraction     Wearing Rx       Sphere Cylinder Axis   Right -1.75 +0.25 029   Left -1.50 Sphere     Age: 72 Year   Type: SVL           IMAGING AND PROCEDURES  Imaging and Procedures for 02/29/2024  OCT, Retina - OU - Both Eyes       Right Eye Quality was good. Central Foveal Thickness: 241. Progression has no prior data. Findings include normal foveal contour, no IRF, no SRF.   Left Eye Quality was good. Central Foveal Thickness: 231. Progression has no prior data. Findings include normal foveal contour, no IRF, no SRF.   Notes *Images captured and stored on drive  Diagnosis / Impression:  NFP; no IRF/SRF OU  Clinical management:  See below  Abbreviations: NFP - Normal foveal profile. CME - cystoid macular edema. PED - pigment epithelial detachment. IRF - intraretinal fluid. SRF - subretinal fluid. EZ - ellipsoid zone. ERM - epiretinal membrane. ORA - outer retinal atrophy. ORT - outer retinal tubulation. SRHM - subretinal hyper-reflective material. IRHM - intraretinal hyper-reflective material           ASSESSMENT/PLAN:   ICD-10-CM   1. Hypertensive retinopathy of both eyes  H35.033 OCT, Retina - OU - Both Eyes    2. Essential hypertension  I10      1,2. Hypertensive retinopathy OU - discussed importance of tight BP control - referred for concern of retinal hemorrhage OD -- no hemorrhages on exam, pt asymptomatic with BCVA 20/20 OU - no retinal or ophthalmic interventions indicated or recommended  - pt can f/u here PRN    Ophthalmic Meds Ordered this visit:  No orders of the defined types were placed in this encounter.     Return if symptoms worsen or fail to improve.  There are no Patient Instructions on file for this visit.  Explained the diagnoses, plan, and follow up with the patient and they expressed understanding.  Patient expressed understanding of the importance of proper follow up care.  This document serves as a record of services personally performed by Redell JUDITHANN Hans, MD, PhD. It was created on their behalf by Avelina Pereyra, COA an ophthalmic technician. The creation of this record is the provider's dictation and/or activities during the visit.   Electronically signed by: Avelina GORMAN Pereyra, COT  02/29/24  9:02 PM    This document serves as a record of services personally performed by Redell JUDITHANN Hans, MD, PhD. It was created on their behalf by Almetta Pesa, an ophthalmic technician. The creation of this record is the provider's dictation and/or activities during the visit.    Electronically signed by: Almetta Pesa, OA, 02/29/24  9:02 PM  Redell JUDITHANN Hans, M.D., Ph.D. Diseases & Surgery of the Retina and Vitreous [facility] 02/29/2024  I have reviewed the above documentation for accuracy and completeness, and I agree with the above. Redell JUDITHANN Hans, M.D., Ph.D. 02/29/24 9:04 PM   Abbreviations: M myopia (nearsighted); A astigmatism; H hyperopia (farsighted); P presbyopia; Mrx spectacle prescription;  CTL contact lenses; OD right eye; OS left eye; OU both eyes  XT exotropia; ET esotropia; PEK punctate epithelial keratitis; PEE punctate epithelial erosions; DES dry eye syndrome; MGD meibomian gland dysfunction; ATs artificial tears; PFAT's preservative free artificial tears; NSC nuclear sclerotic cataract; PSC posterior subcapsular cataract; ERM epi-retinal membrane; PVD posterior vitreous detachment; RD retinal detachment; DM diabetes mellitus; DR diabetic retinopathy; NPDR non-proliferative diabetic retinopathy; PDR proliferative diabetic retinopathy; CSME clinically  significant macular edema; DME diabetic macular edema; dbh dot blot hemorrhages; CWS cotton wool spot; POAG primary open angle glaucoma; C/D cup-to-disc ratio; HVF humphrey visual field; GVF goldmann visual field; OCT optical coherence tomography; IOP intraocular pressure; BRVO Branch retinal vein occlusion; CRVO central retinal vein occlusion; CRAO central retinal artery occlusion; BRAO branch retinal artery occlusion; RT retinal tear; SB scleral buckle; PPV pars plana vitrectomy; VH Vitreous hemorrhage; PRP panretinal laser photocoagulation; IVK intravitreal kenalog; VMT vitreomacular traction; MH Macular hole;  NVD neovascularization of the disc; NVE neovascularization elsewhere; AREDS age related eye disease study; ARMD age related macular degeneration; POAG primary open angle glaucoma; EBMD epithelial/anterior basement membrane dystrophy; ACIOL anterior chamber intraocular lens; IOL intraocular lens; PCIOL posterior chamber intraocular lens; Phaco/IOL phacoemulsification with intraocular lens placement; PRK photorefractive keratectomy; LASIK laser assisted in situ keratomileusis; HTN hypertension; DM diabetes mellitus; COPD chronic obstructive pulmonary disease

## 2024-02-29 ENCOUNTER — Ambulatory Visit (INDEPENDENT_AMBULATORY_CARE_PROVIDER_SITE_OTHER): Admitting: Ophthalmology

## 2024-02-29 ENCOUNTER — Encounter (INDEPENDENT_AMBULATORY_CARE_PROVIDER_SITE_OTHER): Payer: Self-pay | Admitting: Ophthalmology

## 2024-02-29 DIAGNOSIS — I10 Essential (primary) hypertension: Secondary | ICD-10-CM

## 2024-02-29 DIAGNOSIS — H3581 Retinal edema: Secondary | ICD-10-CM

## 2024-02-29 DIAGNOSIS — H35033 Hypertensive retinopathy, bilateral: Secondary | ICD-10-CM

## 2025-02-18 ENCOUNTER — Encounter: Admitting: Family Medicine
